# Patient Record
Sex: Female | Born: 1940 | Race: White | Hispanic: No | Marital: Married | Smoking: Never smoker
Health system: Southern US, Community
[De-identification: ages and names within clinical notes are randomized; demographics above are authoritative.]

## PROBLEM LIST (undated history)

## (undated) DIAGNOSIS — R350 Frequency of micturition: Secondary | ICD-10-CM

## (undated) DIAGNOSIS — E781 Pure hyperglyceridemia: Secondary | ICD-10-CM

## (undated) DIAGNOSIS — Z9889 Other specified postprocedural states: Secondary | ICD-10-CM

## (undated) DIAGNOSIS — G4700 Insomnia, unspecified: Secondary | ICD-10-CM

## (undated) DIAGNOSIS — E119 Type 2 diabetes mellitus without complications: Secondary | ICD-10-CM

## (undated) DIAGNOSIS — M797 Fibromyalgia: Secondary | ICD-10-CM

## (undated) DIAGNOSIS — G5602 Carpal tunnel syndrome, left upper limb: Secondary | ICD-10-CM

## (undated) DIAGNOSIS — I10 Essential (primary) hypertension: Secondary | ICD-10-CM

## (undated) DIAGNOSIS — R112 Nausea with vomiting, unspecified: Secondary | ICD-10-CM

## (undated) HISTORY — PX: TOTAL ABDOMINAL HYSTERECTOMY W/ BILATERAL SALPINGOOPHORECTOMY: SHX83

## (undated) HISTORY — DX: Type 2 diabetes mellitus without complications: E11.9

## (undated) HISTORY — DX: Pure hyperglyceridemia: E78.1

## (undated) HISTORY — PX: TONSILLECTOMY: SUR1361

---

## 1999-05-06 ENCOUNTER — Other Ambulatory Visit: Admission: RE | Admit: 1999-05-06 | Discharge: 1999-05-06 | Payer: Self-pay | Admitting: Obstetrics & Gynecology

## 2004-04-30 ENCOUNTER — Encounter: Admission: RE | Admit: 2004-04-30 | Discharge: 2004-04-30 | Payer: Self-pay

## 2007-10-18 ENCOUNTER — Encounter: Admission: RE | Admit: 2007-10-18 | Discharge: 2007-10-18 | Payer: Self-pay | Admitting: Endocrinology

## 2008-03-19 ENCOUNTER — Encounter: Admission: RE | Admit: 2008-03-19 | Discharge: 2008-03-19 | Payer: Self-pay | Admitting: Endocrinology

## 2008-10-16 ENCOUNTER — Ambulatory Visit (HOSPITAL_COMMUNITY): Admission: RE | Admit: 2008-10-16 | Discharge: 2008-10-16 | Payer: Self-pay | Admitting: *Deleted

## 2008-10-16 ENCOUNTER — Encounter (INDEPENDENT_AMBULATORY_CARE_PROVIDER_SITE_OTHER): Payer: Self-pay | Admitting: *Deleted

## 2010-09-21 ENCOUNTER — Encounter: Payer: Self-pay | Admitting: Endocrinology

## 2011-01-12 NOTE — Op Note (Signed)
NAMEAVERIL, DIGMAN                 ACCOUNT NO.:  [id number]   MEDICAL RECORD NO.:  [id number]          PATIENT TYPE:  AMB   LOCATION:  ENDO                         FACILITY:  [facility]   PHYSICIAN:  Georgiana Spinner, M.D.    DATE OF BIRTH:  25-Feb-1941   DATE OF PROCEDURE:  DATE OF DISCHARGE:                               OPERATIVE REPORT   PROCEDURE:  Colonoscopy.   INDICATIONS:  Colon cancer screening and abdominal pain.   ANESTHESIA:  Fentanyl 50 mcg, Versed 2 mg.   DESCRIPTION OF PROCEDURE:  With the patient mildly sedated in the left  lateral decubitus position, the Pentax videoscopic colonoscope was  inserted in the rectum and passed through a diverticular filled sigmoid  colon to reach the cecum, identified by ileocecal valve and adjacent  cecum, both of which were photographed.  From this point, the  colonoscope was slowly withdrawn taking circumferential views of colonic  mucosa, stopping only in the rectum which appeared normal on direct and  showed hemorrhoids on retroflexed view.  The endoscope was straightened  and withdrawn.  The patient's vital signs and pulse oximeter remained  stable.  The patient tolerated procedure well without apparent  complications.   FINDINGS:  Diverticulosis of the sigmoid colon.  Internal hemorrhoids,  otherwise unremarkable exam.   PLAN:  Consider repeat examination in 5-10 years.           ______________________________  Georgiana Spinner, M.D.     GMO/MEDQ  D:  10/16/2008  T:  10/16/2008  Job:  [id number]

## 2011-01-12 NOTE — Op Note (Signed)
NAMEPORSCHE, NOGUCHI                 ACCOUNT NO.:  [id number]   MEDICAL RECORD NO.:  [id number]          PATIENT TYPE:  AMB   LOCATION:  ENDO                         FACILITY:  [facility]   PHYSICIAN:  Georgiana Spinner, M.D.    DATE OF BIRTH:  11/03/1940   DATE OF PROCEDURE:  10/16/2008  DATE OF DISCHARGE:                               OPERATIVE REPORT   PROCEDURE:  Upper endoscopy.   INDICATIONS:  Abdominal pain.   ANESTHESIA:  Fentanyl 50 mcg, Versed 7 mg.   PROCEDURE:  With the patient mildly sedated in the left lateral  decubitus position the Pentax videoscopic endoscope was inserted into  the mouth, passed under direct vision through the esophagus, which  appeared normal, into the stomach.  Fundus, body, antrum was visualized  and blood flecks were seen throughout the stomach.  Photographs were  taken.  The antrum appeared mildly erythematous and therefore it was  photographed and biopsied.  Duodenal bulb and second portion of duodenum  were visualized and appeared normal.  From this point the endoscope was  slowly withdrawn, taking circumferential views of duodenal mucosa until  the endoscope had been pulled back into the stomach, placed in  retroflexion to view the stomach from below.  The endoscope was  straightened and withdrawn, taking circumferential views of remaining  gastric and esophageal mucosa.  The patient's vital signs, pulse  oximeter remained stable.  The patient tolerated the procedure well,  without apparent complications.   FINDINGS:  Erythematous changes of antrum with blood flecks seen in the  stomach.  Await biopsy report.  The patient will call me for results and  follow up with me as an outpatient.  We will start the patient on PPI  therapy.           ______________________________  Georgiana Spinner, M.D.     GMO/MEDQ  D:  10/16/2008  T:  10/16/2008  Job:  [id number]

## 2011-08-18 ENCOUNTER — Other Ambulatory Visit: Payer: Self-pay | Admitting: Orthopedic Surgery

## 2011-08-21 ENCOUNTER — Other Ambulatory Visit: Payer: Self-pay | Admitting: Orthopedic Surgery

## 2011-09-13 ENCOUNTER — Encounter (HOSPITAL_BASED_OUTPATIENT_CLINIC_OR_DEPARTMENT_OTHER): Payer: Self-pay | Admitting: *Deleted

## 2011-09-13 NOTE — Progress Notes (Signed)
NPO AFTER MN. ARRIVES AT 1030. NEEDS ISTAT AND EKG. WILL TAKE METOPROLOL AND TYLENOL AM OF SURG. W/ SIP OF WATER.

## 2011-09-14 ENCOUNTER — Encounter (HOSPITAL_BASED_OUTPATIENT_CLINIC_OR_DEPARTMENT_OTHER): Payer: Self-pay | Admitting: Anesthesiology

## 2011-09-14 ENCOUNTER — Ambulatory Visit (HOSPITAL_BASED_OUTPATIENT_CLINIC_OR_DEPARTMENT_OTHER)
Admission: RE | Admit: 2011-09-14 | Discharge: 2011-09-14 | Disposition: A | Discharge status: Home / Self Care | Payer: Medicare Other | Source: Ambulatory Visit | Attending: Orthopedic Surgery | Admitting: Orthopedic Surgery

## 2011-09-14 ENCOUNTER — Encounter (HOSPITAL_BASED_OUTPATIENT_CLINIC_OR_DEPARTMENT_OTHER)
Admission: RE | Disposition: A | Discharge status: Home / Self Care | Payer: Self-pay | Source: Ambulatory Visit | Attending: Orthopedic Surgery

## 2011-09-14 ENCOUNTER — Other Ambulatory Visit: Payer: Self-pay

## 2011-09-14 ENCOUNTER — Ambulatory Visit (HOSPITAL_BASED_OUTPATIENT_CLINIC_OR_DEPARTMENT_OTHER): Payer: Medicare Other | Admitting: Anesthesiology

## 2011-09-14 ENCOUNTER — Encounter (HOSPITAL_BASED_OUTPATIENT_CLINIC_OR_DEPARTMENT_OTHER): Payer: Self-pay | Admitting: *Deleted

## 2011-09-14 DIAGNOSIS — G5600 Carpal tunnel syndrome, unspecified upper limb: Secondary | ICD-10-CM | POA: Diagnosis not present

## 2011-09-14 DIAGNOSIS — E119 Type 2 diabetes mellitus without complications: Secondary | ICD-10-CM | POA: Diagnosis not present

## 2011-09-14 DIAGNOSIS — R209 Unspecified disturbances of skin sensation: Secondary | ICD-10-CM | POA: Insufficient documentation

## 2011-09-14 DIAGNOSIS — M720 Palmar fascial fibromatosis [Dupuytren]: Secondary | ICD-10-CM | POA: Diagnosis not present

## 2011-09-14 DIAGNOSIS — Z79899 Other long term (current) drug therapy: Secondary | ICD-10-CM | POA: Diagnosis not present

## 2011-09-14 DIAGNOSIS — IMO0001 Reserved for inherently not codable concepts without codable children: Secondary | ICD-10-CM | POA: Insufficient documentation

## 2011-09-14 DIAGNOSIS — I10 Essential (primary) hypertension: Secondary | ICD-10-CM | POA: Insufficient documentation

## 2011-09-14 DIAGNOSIS — D492 Neoplasm of unspecified behavior of bone, soft tissue, and skin: Secondary | ICD-10-CM | POA: Insufficient documentation

## 2011-09-14 DIAGNOSIS — M79609 Pain in unspecified limb: Secondary | ICD-10-CM | POA: Insufficient documentation

## 2011-09-14 HISTORY — DX: Other specified postprocedural states: R11.2

## 2011-09-14 HISTORY — DX: Essential (primary) hypertension: I10

## 2011-09-14 HISTORY — PX: DUPUYTREN CONTRACTURE RELEASE: SHX1478

## 2011-09-14 HISTORY — DX: Fibromyalgia: M79.7

## 2011-09-14 HISTORY — DX: Frequency of micturition: R35.0

## 2011-09-14 HISTORY — DX: Other specified postprocedural states: Z98.890

## 2011-09-14 HISTORY — PX: CARPAL TUNNEL RELEASE: SHX101

## 2011-09-14 HISTORY — DX: Carpal tunnel syndrome, left upper limb: G56.02

## 2011-09-14 HISTORY — DX: Insomnia, unspecified: G47.00

## 2011-09-14 LAB — GLUCOSE, CAPILLARY: Glucose-Capillary: 172 mg/dL — ABNORMAL HIGH (ref 70–99)

## 2011-09-14 SURGERY — CARPAL TUNNEL RELEASE
Anesthesia: General | Site: Hand | Laterality: Left | Wound class: Clean

## 2011-09-14 MED ORDER — OXYCODONE-ACETAMINOPHEN 5-325 MG PO TABS: 1.0000 | ORAL_TABLET | ORAL | Status: DC | PRN

## 2011-09-14 MED ORDER — PROMETHAZINE HCL 25 MG/ML IJ SOLN
6.2500 mg | INTRAMUSCULAR | Status: DC | PRN
  Administered 2011-09-14: 6.25 mg via INTRAVENOUS

## 2011-09-14 MED ORDER — ONDANSETRON HCL 4 MG/2ML IJ SOLN
INTRAMUSCULAR | Status: DC | PRN
  Administered 2011-09-14: 4 mg via INTRAVENOUS

## 2011-09-14 MED ORDER — DROPERIDOL 2.5 MG/ML IJ SOLN
INTRAMUSCULAR | Status: DC | PRN
  Administered 2011-09-14: 0.625 mg via INTRAVENOUS

## 2011-09-14 MED ORDER — LIDOCAINE HCL (CARDIAC) 20 MG/ML IV SOLN
INTRAVENOUS | Status: DC | PRN
  Administered 2011-09-14: 60 mg via INTRAVENOUS

## 2011-09-14 MED ORDER — DEXTROSE-NACL 5-0.45 % IV SOLN: INTRAVENOUS | Status: DC

## 2011-09-14 MED ORDER — PROPOFOL 10 MG/ML IV EMUL
INTRAVENOUS | Status: DC | PRN
  Administered 2011-09-14: 50 mg via INTRAVENOUS
  Administered 2011-09-14: 40 mg via INTRAVENOUS

## 2011-09-14 MED ORDER — METHOCARBAMOL 500 MG PO TABS: 500.0000 mg | ORAL_TABLET | Freq: Three times a day (TID) | ORAL | Status: DC

## 2011-09-14 MED ORDER — LACTATED RINGERS IV SOLN
INTRAVENOUS | Status: DC
  Administered 2011-09-14: 100 mL/h via INTRAVENOUS
  Administered 2011-09-14: 12:00:00 via INTRAVENOUS

## 2011-09-14 MED ORDER — FENTANYL CITRATE 0.05 MG/ML IJ SOLN
25.0000 ug | INTRAMUSCULAR | Status: DC | PRN
  Administered 2011-09-14 (×2): 25 ug via INTRAVENOUS

## 2011-09-14 MED ORDER — FENTANYL CITRATE 0.05 MG/ML IJ SOLN
INTRAMUSCULAR | Status: DC | PRN
  Administered 2011-09-14 (×3): 25 ug via INTRAVENOUS

## 2011-09-14 MED ORDER — OXYCODONE-ACETAMINOPHEN 5-325 MG PO TABS: 1.0000 | ORAL_TABLET | ORAL | Status: AC | PRN

## 2011-09-14 MED ORDER — POVIDONE-IODINE 7.5 % EX SOLN: Freq: Once | CUTANEOUS | Status: DC

## 2011-09-14 SURGICAL SUPPLY — 48 items
BANDAGE CONFORM 3  STR LF (GAUZE/BANDAGES/DRESSINGS) ×2 IMPLANT
BANDAGE ELASTIC 3 VELCRO ST LF (GAUZE/BANDAGES/DRESSINGS) ×1 IMPLANT
BANDAGE ELASTIC 4 VELCRO ST LF (GAUZE/BANDAGES/DRESSINGS) IMPLANT
BLADE SURG 15 STRL LF DISP TIS (BLADE) ×1 IMPLANT
BLADE SURG 15 STRL SS (BLADE) ×2
BNDG CMPR 9X4 STRL LF SNTH (GAUZE/BANDAGES/DRESSINGS) ×1
BNDG ESMARK 4X9 LF (GAUZE/BANDAGES/DRESSINGS) ×2 IMPLANT
CLOTH BEACON ORANGE TIMEOUT ST (SAFETY) ×2 IMPLANT
CORDS BIPOLAR (ELECTRODE) ×2 IMPLANT
COVER TABLE BACK 60X90 (DRAPES) ×2 IMPLANT
DRAPE EXTREMITY T 121X128X90 (DRAPE) ×2 IMPLANT
DRAPE LG THREE QUARTER DISP (DRAPES) ×4 IMPLANT
DRAPE U-SHAPE 47X51 STRL (DRAPES) ×2 IMPLANT
DRSG EMULSION OIL 3X3 NADH (GAUZE/BANDAGES/DRESSINGS) ×2 IMPLANT
DURAPREP 26ML APPLICATOR (WOUND CARE) ×2 IMPLANT
ELECT REM PT RETURN 9FT ADLT (ELECTROSURGICAL)
ELECTRODE REM PT RTRN 9FT ADLT (ELECTROSURGICAL) IMPLANT
GLOVE BIOGEL PI IND STRL 6.5 (GLOVE) IMPLANT
GLOVE BIOGEL PI INDICATOR 6.5 (GLOVE) ×1
GLOVE ECLIPSE 6.0 STRL STRAW (GLOVE) ×1 IMPLANT
GLOVE ECLIPSE 8.0 STRL XLNG CF (GLOVE) ×3 IMPLANT
GLOVE INDICATOR 8.0 STRL GRN (GLOVE) ×2 IMPLANT
GOWN W/COTTON TOWEL STD LRG (GOWNS) ×2 IMPLANT
GOWN XL W/COTTON TOWEL STD (GOWNS) ×2 IMPLANT
MARKER SKIN DUAL TIP RULER LAB (MISCELLANEOUS) ×2 IMPLANT
NEEDLE 27GAX1X1/2 (NEEDLE) IMPLANT
NEEDLE HYPO 22GX1.5 SAFETY (NEEDLE) IMPLANT
NS IRRIG 500ML POUR BTL (IV SOLUTION) ×2 IMPLANT
PACK BASIN DAY SURGERY FS (CUSTOM PROCEDURE TRAY) ×2 IMPLANT
PAD CAST 3X4 CTTN HI CHSV (CAST SUPPLIES) IMPLANT
PAD CAST 4YDX4 CTTN HI CHSV (CAST SUPPLIES) IMPLANT
PADDING CAST ABS 4INX4YD NS (CAST SUPPLIES) ×1
PADDING CAST ABS COTTON 4X4 ST (CAST SUPPLIES) ×1 IMPLANT
PADDING CAST COTTON 3X4 STRL (CAST SUPPLIES)
PADDING CAST COTTON 4X4 STRL (CAST SUPPLIES)
PADDING WEBRIL 4 STERILE (GAUZE/BANDAGES/DRESSINGS) ×1 IMPLANT
PENCIL BUTTON HOLSTER BLD 10FT (ELECTRODE) IMPLANT
SPLINT PLASTER 3X15 (CAST SUPPLIES) ×1 IMPLANT
SPLINT PLASTER CAST XFAST 3X15 (CAST SUPPLIES) IMPLANT
SPLINT PLASTER XTRA FASTSET 3X (CAST SUPPLIES)
SPONGE GAUZE 4X4 12PLY (GAUZE/BANDAGES/DRESSINGS) ×2 IMPLANT
STOCKINETTE 4X48 STRL (DRAPES) ×2 IMPLANT
SUT ETHILON 4 0 PS 2 18 (SUTURE) ×4 IMPLANT
SYR BULB 3OZ (MISCELLANEOUS) ×2 IMPLANT
SYR CONTROL 10ML LL (SYRINGE) IMPLANT
TOWEL OR 17X24 6PK STRL BLUE (TOWEL DISPOSABLE) ×4 IMPLANT
UNDERPAD 30X30 INCONTINENT (UNDERPADS AND DIAPERS) ×2 IMPLANT
WATER STERILE IRR 500ML POUR (IV SOLUTION) ×2 IMPLANT

## 2011-09-14 NOTE — Anesthesia Preprocedure Evaluation (Signed)
Anesthesia Evaluation  Patient identified by MRN, date of birth, ID band Patient awake    Reviewed: Allergy & Precautions, H&P , NPO status , Patient's Chart, lab work & pertinent test results  History of Anesthesia Complications (+) PONV  Airway Mallampati: II TM Distance: >3 FB Neck ROM: Full    Dental No notable dental hx.    Pulmonary neg pulmonary ROS,  clear to auscultation  Pulmonary exam normal       Cardiovascular hypertension, neg cardio ROS Regular Normal    Neuro/Psych Negative Neurological ROS  Negative Psych ROS   GI/Hepatic negative GI ROS, Neg liver ROS,   Endo/Other  Negative Endocrine ROSDiabetes mellitus-  Renal/GU negative Renal ROS  Genitourinary negative   Musculoskeletal negative musculoskeletal ROS (+) Fibromyalgia -  Abdominal   Peds negative pediatric ROS (+)  Hematology negative hematology ROS (+)   Anesthesia Other Findings   Reproductive/Obstetrics negative OB ROS                           Anesthesia Physical Anesthesia Plan  ASA: II  Anesthesia Plan: General   Post-op Pain Management:    Induction: Intravenous  Airway Management Planned: LMA  Additional Equipment:   Intra-op Plan:   Post-operative Plan:   Informed Consent: I have reviewed the patients History and Physical, chart, labs and discussed the procedure including the risks, benefits and alternatives for the proposed anesthesia with the patient or authorized representative who has indicated his/her understanding and acceptance.   Dental advisory given  Plan Discussed with: CRNA  Anesthesia Plan Comments:         Anesthesia Quick Evaluation

## 2011-09-14 NOTE — Transfer of Care (Signed)
Immediate Anesthesia Transfer of Care Note  Patient: Lori Michael  Procedure(s) Performed:  CARPAL TUNNEL RELEASE; DUPUYTREN CONTRACTURE RELEASE  Patient Location: PACU  Anesthesia Type: General  Level of Consciousness: sedated and responds to stimulation  Airway & Oxygen Therapy: Patient Spontanous Breathing  Post-op Assessment: Report given to PACU RN  Post vital signs: Reviewed and stable Filed Vitals:   09/14/11 1411  BP: 198/85  Pulse:   Temp: 35.9 C  Resp: 15    Complications: No apparent anesthesia complications

## 2011-09-14 NOTE — Anesthesia Procedure Notes (Signed)
Procedure Name: LMA Insertion Date/Time: 09/14/2011 1:08 PM Performed by: Maris Berger Pre-anesthesia Checklist: Patient identified, Emergency Drugs available, Suction available and Patient being monitored Patient Re-evaluated:Patient Re-evaluated prior to inductionOxygen Delivery Method: Circle System Utilized Preoxygenation: Pre-oxygenation with 100% oxygen Intubation Type: IV induction Ventilation: Mask ventilation without difficulty LMA: LMA inserted and LMA with gastric port inserted LMA Size: 4.0 Number of attempts: 1 Placement Confirmation: positive ETCO2 Tube secured with: Tape Dental Injury: Teeth and Oropharynx as per pre-operative assessment

## 2011-09-14 NOTE — Brief Op Note (Signed)
09/14/2011  2:10 PM  PATIENT:  Lori Michael  71 y.o. female  PRE-OPERATIVE DIAGNOSIS:  left carpal tunel syndrome, Dupuytrens nodule left hand  POST-OPERATIVE DIAGNOSIS:  left carpal tunel syndrome, Dupuytrens left hand  PROCEDURE:  Procedure(s): CARPAL TUNNEL RELEASE Excision Dupuytren's nodule left hand  SURGEON:  Surgeon(s): Fayrene Fearing P Trenae Brunke  PHYSICIAN ASSISTANT:   ASSISTANTS: Mr Idolina Primer [facility]  ANESTHESIA:   general  EBL:  Total I/O In: 150 [I.V.:150] Out: -   BLOOD ADMINISTERED:none  DRAINS: none   LOCAL MEDICATIONS USED:  NONE  SPECIMEN:  No Specimen  DISPOSITION OF SPECIMEN:  N/A  COUNTS:  YES  TOURNIQUET:   Total Tourniquet Time Documented: Upper Arm (Left) - 45 minutes  DICTATION: .Other Dictation: Dictation Number [id number]  PLAN OF CARE: Discharge to home after PACU  PATIENT DISPOSITION:  PACU - hemodynamically stable.

## 2011-09-14 NOTE — Progress Notes (Signed)
Report received from [facility] for pt relief

## 2011-09-14 NOTE — H&P (Signed)
Lori Michael is an 71 y.o. female.   Chief Complaint: pain and numbness,nodule lt hand HPI: NCV'  Demonstrate median neuropathy  Past Medical History  Diagnosis Date  . Hypertension   . Diabetes mellitus ORAL MEDS  . Frequency of urination   . Fibromyalgia   . Insomnia   . Carpal tunnel syndrome on left   . PONV (postoperative nausea and vomiting)     Past Surgical History  Procedure Date  . Total abdominal hysterectomy w/ bilateral salpingoophorectomy AGE 44  . Tonsillectomy AGE 71    History reviewed. No pertinent family history. Social History:  reports that she has never smoked. She has never used smokeless tobacco. She reports that she does not drink alcohol or use illicit drugs.  Allergies: No Known Allergies  Medications Prior to Admission  Medication Dose Route Frequency Provider Last Rate Last Dose  . dextrose 5 %-0.45 % sodium chloride infusion   Intravenous Continuous James P Aplington      . lactated ringers infusion   Intravenous Continuous Einar Pheasant, MD      . povidone-iodine (BETADINE) 7.5 % scrub   Topical Once James P Aplington       Medications Prior to Admission  Medication Sig Dispense Refill  . acetaminophen (TYLENOL) 500 MG tablet Take 500 mg by mouth every 6 (six) hours as needed.      . Amlodipine Besylate-Valsartan (EXFORGE PO) Take 1 tablet by mouth every morning.      . Estradiol (ESTRACE VA) Place 1 application vaginally once a week.      Marland Kitchen GLIMEPIRIDE PO Take 1 tablet by mouth 2 (two) times daily.      . metoprolol succinate (TOPROL-XL) 50 MG 24 hr tablet Take 50 mg by mouth every morning. Take with or immediately following a meal.        Results for orders placed during the hospital encounter of 09/14/11 (from the past 48 hour(s))  POCT I-STAT 4, (NA,K, GLUC, HGB,HCT)     Status: Abnormal   Collection Time   09/14/11 11:52 AM      Component Value Range Comment   Sodium 144  135 - 145 (mEq/L)    Potassium 3.5  3.5 - 5.1 (mEq/L)    Glucose, Bld 181 (*) 70 - 99 (mg/dL)    HCT 56.2  13.0 - 86.5 (%)    Hemoglobin 15.3 (*) 12.0 - 15.0 (g/dL)    No results found.  ROS  Blood pressure 187/86, pulse 66, temperature 97.4 F (36.3 C), temperature source Oral, resp. rate 20, height 5\' 3"  (1.6 m), weight 65.772 kg (145 lb), SpO2 96.00%. Physical Exam  Vitals reviewed. Constitutional: She is oriented to person, place, and time. She appears well-developed and well-nourished.  HENT:  Head: Normocephalic and atraumatic.  Right Ear: External ear normal.  Left Ear: External ear normal.  Eyes: Conjunctivae and EOM are normal. Pupils are equal, round, and reactive to light.  Neck: Normal range of motion. Neck supple.  Cardiovascular: Normal rate and regular rhythm.   Respiratory: Effort normal and breath sounds normal.  GI: Soft. Bowel sounds are normal.  Musculoskeletal: Normal range of motion.  Neurological: She is alert and oriented to person, place, and time. She has normal reflexes.  Skin: Skin is warm and dry.  Psychiatric: She has a normal mood and affect. Her behavior is normal. Judgment and thought content normal.     Assessment/Plan Carpal tunnel syndrome and Dupuytrens nodule lt hand Carpal tunnel release and  excision nodule  APLINGTON,JAMES P 09/14/2011, 12:57 PM

## 2011-09-15 ENCOUNTER — Encounter (HOSPITAL_BASED_OUTPATIENT_CLINIC_OR_DEPARTMENT_OTHER): Payer: Self-pay | Admitting: Orthopedic Surgery

## 2011-09-15 NOTE — Anesthesia Postprocedure Evaluation (Signed)
  Anesthesia Post-op Note  Patient: Lori Michael  Procedure(s) Performed:  CARPAL TUNNEL RELEASE; DUPUYTREN CONTRACTURE RELEASE  Patient Location: PACU  Anesthesia Type: General  Level of Consciousness: awake and alert   Airway and Oxygen Therapy: Patient Spontanous Breathing  Post-op Pain: mild  Post-op Assessment: Post-op Vital signs reviewed, Patient's Cardiovascular Status Stable, Respiratory Function Stable, Patent Airway and No signs of Nausea or vomiting  Post-op Vital Signs: stable  Complications: No apparent anesthesia complications

## 2011-09-15 NOTE — Op Note (Signed)
NAMEBOBBIJO, HOLST                 ACCOUNT NO.:  [id number]  MEDICAL RECORD NO.:  [id number]  LOCATION:                               FACILITY:  [facility]  PHYSICIAN:  Marlowe Kays, M.D.  DATE OF BIRTH:  [birth year]  DATE OF PROCEDURE:  09/14/2011 DATE OF DISCHARGE:                              OPERATIVE REPORT   PREOPERATIVE DIAGNOSES: 1. Carpal tunnel syndrome. 2. Painful Dupuytren nodule mid palm, left hand.  POSTOPERATIVE DIAGNOSES: 1. Carpal tunnel syndrome. 2. Painful Dupuytren nodule mid palm, left hand.  OPERATION: 1. Decompression of median nerve, left wrist and hand. 2. Excision of Dupuytren nodule mid palm, left.  SURGEON:  Marlowe Kays, MD  ASSISTANT:  Druscilla Brownie. Idolina Primer, PA-C  ANESTHESIA:  General.  PATHOLOGY AND JUSTIFICATION FOR PROCEDURE:  Because of pain and numbness in her left hand, nerve conduction studies were taken and demonstrated a median neuropathy.  The nodule was very characteristic of Dupuytren nodule, was very tender.  PROCEDURE IN DETAIL:  Satisfied general anesthesia, pneumatic tourniquet, left upper extremity was Esmarched out nonsterilely and tourniquet inflated and the arm prepped from midforearm to fingertips with DuraPrep and draped as sterile field.  I marked out my usual curved incision along the base of thenar eminence crossing obliquely over the flexor crease in the distal forearm.  The incision was altered slightly to the mid palm area, so that I would have access to the nodule as well. After time-out performed, made incision over the skin line and at the wrist located at the palmaris longus tendon which I retracted to the radial side and the median nerve was identified and protected as I began releasing skin, subcutaneous tissue, and fascia into the distal palm. At the level of the Dupuytren nodule, with sharp dissection, I undermined the skin and was able to remove the nodule completely.  There was no perforation of the skin.   Wound was irrigated with sterile saline and the skin and subcutaneous tissue closed with interrupted 4-0 nylon mattress sutures. Betadine, Adaptic, dry sterile dressing, volar plaster splint were applied.  Tourniquet was released.  She tolerated the procedure well, was taken to recovery room in satisfied condition with no known complications.          ______________________________ Marlowe Kays, M.D.     JA/MEDQ  D:  09/14/2011  T:  09/14/2011  Job:  [id number]

## 2011-11-01 DIAGNOSIS — G5600 Carpal tunnel syndrome, unspecified upper limb: Secondary | ICD-10-CM | POA: Diagnosis not present

## 2011-11-12 DIAGNOSIS — G5600 Carpal tunnel syndrome, unspecified upper limb: Secondary | ICD-10-CM | POA: Diagnosis not present

## 2011-11-19 DIAGNOSIS — G5600 Carpal tunnel syndrome, unspecified upper limb: Secondary | ICD-10-CM | POA: Diagnosis not present

## 2011-11-24 DIAGNOSIS — G5600 Carpal tunnel syndrome, unspecified upper limb: Secondary | ICD-10-CM | POA: Diagnosis not present

## 2011-12-03 DIAGNOSIS — G5600 Carpal tunnel syndrome, unspecified upper limb: Secondary | ICD-10-CM | POA: Diagnosis not present

## 2011-12-10 DIAGNOSIS — E119 Type 2 diabetes mellitus without complications: Secondary | ICD-10-CM | POA: Diagnosis not present

## 2011-12-10 DIAGNOSIS — I10 Essential (primary) hypertension: Secondary | ICD-10-CM | POA: Diagnosis not present

## 2011-12-10 DIAGNOSIS — E789 Disorder of lipoprotein metabolism, unspecified: Secondary | ICD-10-CM | POA: Diagnosis not present

## 2011-12-10 DIAGNOSIS — G5600 Carpal tunnel syndrome, unspecified upper limb: Secondary | ICD-10-CM | POA: Diagnosis not present

## 2011-12-14 DIAGNOSIS — G5600 Carpal tunnel syndrome, unspecified upper limb: Secondary | ICD-10-CM | POA: Diagnosis not present

## 2011-12-17 DIAGNOSIS — E789 Disorder of lipoprotein metabolism, unspecified: Secondary | ICD-10-CM | POA: Diagnosis not present

## 2011-12-17 DIAGNOSIS — L909 Atrophic disorder of skin, unspecified: Secondary | ICD-10-CM | POA: Diagnosis not present

## 2011-12-17 DIAGNOSIS — L919 Hypertrophic disorder of the skin, unspecified: Secondary | ICD-10-CM | POA: Diagnosis not present

## 2011-12-17 DIAGNOSIS — E119 Type 2 diabetes mellitus without complications: Secondary | ICD-10-CM | POA: Diagnosis not present

## 2011-12-17 DIAGNOSIS — I10 Essential (primary) hypertension: Secondary | ICD-10-CM | POA: Diagnosis not present

## 2011-12-17 DIAGNOSIS — E2839 Other primary ovarian failure: Secondary | ICD-10-CM | POA: Diagnosis not present

## 2011-12-20 DIAGNOSIS — G5600 Carpal tunnel syndrome, unspecified upper limb: Secondary | ICD-10-CM | POA: Diagnosis not present

## 2011-12-27 DIAGNOSIS — G5600 Carpal tunnel syndrome, unspecified upper limb: Secondary | ICD-10-CM | POA: Diagnosis not present

## 2012-01-04 DIAGNOSIS — G5600 Carpal tunnel syndrome, unspecified upper limb: Secondary | ICD-10-CM | POA: Diagnosis not present

## 2012-01-11 DIAGNOSIS — G5600 Carpal tunnel syndrome, unspecified upper limb: Secondary | ICD-10-CM | POA: Diagnosis not present

## 2012-01-17 DIAGNOSIS — G5600 Carpal tunnel syndrome, unspecified upper limb: Secondary | ICD-10-CM | POA: Diagnosis not present

## 2012-01-18 DIAGNOSIS — E119 Type 2 diabetes mellitus without complications: Secondary | ICD-10-CM | POA: Diagnosis not present

## 2012-01-18 DIAGNOSIS — I10 Essential (primary) hypertension: Secondary | ICD-10-CM | POA: Diagnosis not present

## 2012-01-18 DIAGNOSIS — E789 Disorder of lipoprotein metabolism, unspecified: Secondary | ICD-10-CM | POA: Diagnosis not present

## 2012-04-10 DIAGNOSIS — G5600 Carpal tunnel syndrome, unspecified upper limb: Secondary | ICD-10-CM | POA: Diagnosis not present

## 2012-04-10 DIAGNOSIS — E789 Disorder of lipoprotein metabolism, unspecified: Secondary | ICD-10-CM | POA: Diagnosis not present

## 2012-04-10 DIAGNOSIS — M720 Palmar fascial fibromatosis [Dupuytren]: Secondary | ICD-10-CM | POA: Diagnosis not present

## 2012-04-10 DIAGNOSIS — E119 Type 2 diabetes mellitus without complications: Secondary | ICD-10-CM | POA: Diagnosis not present

## 2012-04-18 DIAGNOSIS — M79609 Pain in unspecified limb: Secondary | ICD-10-CM | POA: Diagnosis not present

## 2012-04-18 DIAGNOSIS — I10 Essential (primary) hypertension: Secondary | ICD-10-CM | POA: Diagnosis not present

## 2012-04-18 DIAGNOSIS — E119 Type 2 diabetes mellitus without complications: Secondary | ICD-10-CM | POA: Diagnosis not present

## 2012-04-18 DIAGNOSIS — E789 Disorder of lipoprotein metabolism, unspecified: Secondary | ICD-10-CM | POA: Diagnosis not present

## 2012-06-12 DIAGNOSIS — M720 Palmar fascial fibromatosis [Dupuytren]: Secondary | ICD-10-CM | POA: Diagnosis not present

## 2012-06-12 DIAGNOSIS — G5600 Carpal tunnel syndrome, unspecified upper limb: Secondary | ICD-10-CM | POA: Diagnosis not present

## 2012-08-07 DIAGNOSIS — G5600 Carpal tunnel syndrome, unspecified upper limb: Secondary | ICD-10-CM | POA: Diagnosis not present

## 2012-08-07 DIAGNOSIS — R229 Localized swelling, mass and lump, unspecified: Secondary | ICD-10-CM | POA: Diagnosis not present

## 2012-08-08 DIAGNOSIS — E789 Disorder of lipoprotein metabolism, unspecified: Secondary | ICD-10-CM | POA: Diagnosis not present

## 2012-08-08 DIAGNOSIS — E119 Type 2 diabetes mellitus without complications: Secondary | ICD-10-CM | POA: Diagnosis not present

## 2012-08-15 DIAGNOSIS — E119 Type 2 diabetes mellitus without complications: Secondary | ICD-10-CM | POA: Diagnosis not present

## 2012-08-15 DIAGNOSIS — I10 Essential (primary) hypertension: Secondary | ICD-10-CM | POA: Diagnosis not present

## 2012-08-15 DIAGNOSIS — E789 Disorder of lipoprotein metabolism, unspecified: Secondary | ICD-10-CM | POA: Diagnosis not present

## 2012-09-30 DIAGNOSIS — J209 Acute bronchitis, unspecified: Secondary | ICD-10-CM | POA: Diagnosis not present

## 2012-09-30 DIAGNOSIS — J018 Other acute sinusitis: Secondary | ICD-10-CM | POA: Diagnosis not present

## 2012-11-07 DIAGNOSIS — Z1231 Encounter for screening mammogram for malignant neoplasm of breast: Secondary | ICD-10-CM | POA: Diagnosis not present

## 2012-12-11 DIAGNOSIS — Z79899 Other long term (current) drug therapy: Secondary | ICD-10-CM | POA: Diagnosis not present

## 2012-12-11 DIAGNOSIS — E119 Type 2 diabetes mellitus without complications: Secondary | ICD-10-CM | POA: Diagnosis not present

## 2012-12-11 DIAGNOSIS — I10 Essential (primary) hypertension: Secondary | ICD-10-CM | POA: Diagnosis not present

## 2012-12-11 DIAGNOSIS — E789 Disorder of lipoprotein metabolism, unspecified: Secondary | ICD-10-CM | POA: Diagnosis not present

## 2012-12-18 DIAGNOSIS — I10 Essential (primary) hypertension: Secondary | ICD-10-CM | POA: Diagnosis not present

## 2012-12-18 DIAGNOSIS — E789 Disorder of lipoprotein metabolism, unspecified: Secondary | ICD-10-CM | POA: Diagnosis not present

## 2012-12-18 DIAGNOSIS — J9819 Other pulmonary collapse: Secondary | ICD-10-CM | POA: Diagnosis not present

## 2012-12-18 DIAGNOSIS — E119 Type 2 diabetes mellitus without complications: Secondary | ICD-10-CM | POA: Diagnosis not present

## 2013-02-06 DIAGNOSIS — E11329 Type 2 diabetes mellitus with mild nonproliferative diabetic retinopathy without macular edema: Secondary | ICD-10-CM | POA: Diagnosis not present

## 2013-02-06 DIAGNOSIS — E1139 Type 2 diabetes mellitus with other diabetic ophthalmic complication: Secondary | ICD-10-CM | POA: Diagnosis not present

## 2013-02-06 DIAGNOSIS — H2510 Age-related nuclear cataract, unspecified eye: Secondary | ICD-10-CM | POA: Diagnosis not present

## 2013-03-12 DIAGNOSIS — E119 Type 2 diabetes mellitus without complications: Secondary | ICD-10-CM | POA: Diagnosis not present

## 2013-03-12 DIAGNOSIS — E789 Disorder of lipoprotein metabolism, unspecified: Secondary | ICD-10-CM | POA: Diagnosis not present

## 2013-03-19 DIAGNOSIS — E789 Disorder of lipoprotein metabolism, unspecified: Secondary | ICD-10-CM | POA: Diagnosis not present

## 2013-03-19 DIAGNOSIS — I10 Essential (primary) hypertension: Secondary | ICD-10-CM | POA: Diagnosis not present

## 2013-03-19 DIAGNOSIS — E119 Type 2 diabetes mellitus without complications: Secondary | ICD-10-CM | POA: Diagnosis not present

## 2013-07-11 DIAGNOSIS — E119 Type 2 diabetes mellitus without complications: Secondary | ICD-10-CM | POA: Diagnosis not present

## 2013-07-18 DIAGNOSIS — Z23 Encounter for immunization: Secondary | ICD-10-CM | POA: Diagnosis not present

## 2013-07-18 DIAGNOSIS — I10 Essential (primary) hypertension: Secondary | ICD-10-CM | POA: Diagnosis not present

## 2013-07-18 DIAGNOSIS — E119 Type 2 diabetes mellitus without complications: Secondary | ICD-10-CM | POA: Diagnosis not present

## 2013-07-18 DIAGNOSIS — E789 Disorder of lipoprotein metabolism, unspecified: Secondary | ICD-10-CM | POA: Diagnosis not present

## 2013-08-07 DIAGNOSIS — H2510 Age-related nuclear cataract, unspecified eye: Secondary | ICD-10-CM | POA: Diagnosis not present

## 2013-08-07 DIAGNOSIS — E11329 Type 2 diabetes mellitus with mild nonproliferative diabetic retinopathy without macular edema: Secondary | ICD-10-CM | POA: Diagnosis not present

## 2013-08-15 DIAGNOSIS — E119 Type 2 diabetes mellitus without complications: Secondary | ICD-10-CM | POA: Diagnosis not present

## 2013-08-21 DIAGNOSIS — E119 Type 2 diabetes mellitus without complications: Secondary | ICD-10-CM | POA: Diagnosis not present

## 2013-08-21 DIAGNOSIS — E789 Disorder of lipoprotein metabolism, unspecified: Secondary | ICD-10-CM | POA: Diagnosis not present

## 2013-08-21 DIAGNOSIS — I10 Essential (primary) hypertension: Secondary | ICD-10-CM | POA: Diagnosis not present

## 2013-10-30 DIAGNOSIS — I10 Essential (primary) hypertension: Secondary | ICD-10-CM | POA: Diagnosis not present

## 2013-10-30 DIAGNOSIS — E119 Type 2 diabetes mellitus without complications: Secondary | ICD-10-CM | POA: Diagnosis not present

## 2013-10-30 DIAGNOSIS — E789 Disorder of lipoprotein metabolism, unspecified: Secondary | ICD-10-CM | POA: Diagnosis not present

## 2013-11-16 DIAGNOSIS — H25049 Posterior subcapsular polar age-related cataract, unspecified eye: Secondary | ICD-10-CM | POA: Diagnosis not present

## 2013-11-16 DIAGNOSIS — H25019 Cortical age-related cataract, unspecified eye: Secondary | ICD-10-CM | POA: Diagnosis not present

## 2013-11-16 DIAGNOSIS — H18419 Arcus senilis, unspecified eye: Secondary | ICD-10-CM | POA: Diagnosis not present

## 2013-11-16 DIAGNOSIS — E119 Type 2 diabetes mellitus without complications: Secondary | ICD-10-CM | POA: Diagnosis not present

## 2013-11-16 DIAGNOSIS — H02839 Dermatochalasis of unspecified eye, unspecified eyelid: Secondary | ICD-10-CM | POA: Diagnosis not present

## 2013-11-16 DIAGNOSIS — H2510 Age-related nuclear cataract, unspecified eye: Secondary | ICD-10-CM | POA: Diagnosis not present

## 2013-11-28 HISTORY — PX: OTHER SURGICAL HISTORY: SHX169

## 2013-12-18 DIAGNOSIS — E119 Type 2 diabetes mellitus without complications: Secondary | ICD-10-CM | POA: Diagnosis not present

## 2013-12-18 DIAGNOSIS — Z79899 Other long term (current) drug therapy: Secondary | ICD-10-CM | POA: Diagnosis not present

## 2013-12-18 DIAGNOSIS — I10 Essential (primary) hypertension: Secondary | ICD-10-CM | POA: Diagnosis not present

## 2013-12-18 DIAGNOSIS — E789 Disorder of lipoprotein metabolism, unspecified: Secondary | ICD-10-CM | POA: Diagnosis not present

## 2013-12-25 DIAGNOSIS — E789 Disorder of lipoprotein metabolism, unspecified: Secondary | ICD-10-CM | POA: Diagnosis not present

## 2013-12-25 DIAGNOSIS — I10 Essential (primary) hypertension: Secondary | ICD-10-CM | POA: Diagnosis not present

## 2013-12-25 DIAGNOSIS — Z01818 Encounter for other preprocedural examination: Secondary | ICD-10-CM | POA: Diagnosis not present

## 2013-12-25 DIAGNOSIS — E119 Type 2 diabetes mellitus without complications: Secondary | ICD-10-CM | POA: Diagnosis not present

## 2013-12-31 DIAGNOSIS — H2510 Age-related nuclear cataract, unspecified eye: Secondary | ICD-10-CM | POA: Diagnosis not present

## 2013-12-31 DIAGNOSIS — H52209 Unspecified astigmatism, unspecified eye: Secondary | ICD-10-CM | POA: Diagnosis not present

## 2013-12-31 DIAGNOSIS — H269 Unspecified cataract: Secondary | ICD-10-CM | POA: Diagnosis not present

## 2014-01-01 DIAGNOSIS — H2510 Age-related nuclear cataract, unspecified eye: Secondary | ICD-10-CM | POA: Diagnosis not present

## 2014-01-14 DIAGNOSIS — H2510 Age-related nuclear cataract, unspecified eye: Secondary | ICD-10-CM | POA: Diagnosis not present

## 2014-01-14 DIAGNOSIS — H52209 Unspecified astigmatism, unspecified eye: Secondary | ICD-10-CM | POA: Diagnosis not present

## 2014-01-14 DIAGNOSIS — H269 Unspecified cataract: Secondary | ICD-10-CM | POA: Diagnosis not present

## 2014-01-24 DIAGNOSIS — I10 Essential (primary) hypertension: Secondary | ICD-10-CM | POA: Diagnosis not present

## 2014-02-18 DIAGNOSIS — N951 Menopausal and female climacteric states: Secondary | ICD-10-CM | POA: Diagnosis not present

## 2014-02-18 DIAGNOSIS — Z1231 Encounter for screening mammogram for malignant neoplasm of breast: Secondary | ICD-10-CM | POA: Diagnosis not present

## 2014-04-22 DIAGNOSIS — E119 Type 2 diabetes mellitus without complications: Secondary | ICD-10-CM | POA: Diagnosis not present

## 2014-04-29 DIAGNOSIS — E119 Type 2 diabetes mellitus without complications: Secondary | ICD-10-CM | POA: Diagnosis not present

## 2014-04-29 DIAGNOSIS — M899 Disorder of bone, unspecified: Secondary | ICD-10-CM | POA: Diagnosis not present

## 2014-04-29 DIAGNOSIS — R5381 Other malaise: Secondary | ICD-10-CM | POA: Diagnosis not present

## 2014-04-29 DIAGNOSIS — M949 Disorder of cartilage, unspecified: Secondary | ICD-10-CM | POA: Diagnosis not present

## 2014-04-29 DIAGNOSIS — R5383 Other fatigue: Secondary | ICD-10-CM | POA: Diagnosis not present

## 2014-04-29 DIAGNOSIS — I10 Essential (primary) hypertension: Secondary | ICD-10-CM | POA: Diagnosis not present

## 2014-04-29 DIAGNOSIS — Z79899 Other long term (current) drug therapy: Secondary | ICD-10-CM | POA: Diagnosis not present

## 2014-08-12 DIAGNOSIS — J0100 Acute maxillary sinusitis, unspecified: Secondary | ICD-10-CM | POA: Diagnosis not present

## 2014-08-19 DIAGNOSIS — E119 Type 2 diabetes mellitus without complications: Secondary | ICD-10-CM | POA: Diagnosis not present

## 2014-08-19 DIAGNOSIS — H547 Unspecified visual loss: Secondary | ICD-10-CM | POA: Diagnosis not present

## 2014-08-19 DIAGNOSIS — H47213 Primary optic atrophy, bilateral: Secondary | ICD-10-CM | POA: Diagnosis not present

## 2014-08-20 DIAGNOSIS — E118 Type 2 diabetes mellitus with unspecified complications: Secondary | ICD-10-CM | POA: Diagnosis not present

## 2014-08-27 DIAGNOSIS — Z23 Encounter for immunization: Secondary | ICD-10-CM | POA: Diagnosis not present

## 2014-08-27 DIAGNOSIS — F439 Reaction to severe stress, unspecified: Secondary | ICD-10-CM | POA: Diagnosis not present

## 2014-08-27 DIAGNOSIS — E118 Type 2 diabetes mellitus with unspecified complications: Secondary | ICD-10-CM | POA: Diagnosis not present

## 2014-08-27 DIAGNOSIS — I10 Essential (primary) hypertension: Secondary | ICD-10-CM | POA: Diagnosis not present

## 2014-12-23 DIAGNOSIS — E789 Disorder of lipoprotein metabolism, unspecified: Secondary | ICD-10-CM | POA: Diagnosis not present

## 2014-12-23 DIAGNOSIS — E118 Type 2 diabetes mellitus with unspecified complications: Secondary | ICD-10-CM | POA: Diagnosis not present

## 2014-12-23 DIAGNOSIS — I10 Essential (primary) hypertension: Secondary | ICD-10-CM | POA: Diagnosis not present

## 2014-12-30 DIAGNOSIS — M1810 Unilateral primary osteoarthritis of first carpometacarpal joint, unspecified hand: Secondary | ICD-10-CM | POA: Diagnosis not present

## 2014-12-30 DIAGNOSIS — E559 Vitamin D deficiency, unspecified: Secondary | ICD-10-CM | POA: Diagnosis not present

## 2014-12-30 DIAGNOSIS — I10 Essential (primary) hypertension: Secondary | ICD-10-CM | POA: Diagnosis not present

## 2014-12-30 DIAGNOSIS — E118 Type 2 diabetes mellitus with unspecified complications: Secondary | ICD-10-CM | POA: Diagnosis not present

## 2015-05-27 DIAGNOSIS — W57XXXA Bitten or stung by nonvenomous insect and other nonvenomous arthropods, initial encounter: Secondary | ICD-10-CM | POA: Diagnosis not present

## 2015-05-27 DIAGNOSIS — R21 Rash and other nonspecific skin eruption: Secondary | ICD-10-CM | POA: Diagnosis not present

## 2015-06-25 DIAGNOSIS — E789 Disorder of lipoprotein metabolism, unspecified: Secondary | ICD-10-CM | POA: Diagnosis not present

## 2015-06-25 DIAGNOSIS — E118 Type 2 diabetes mellitus with unspecified complications: Secondary | ICD-10-CM | POA: Diagnosis not present

## 2015-06-25 DIAGNOSIS — I10 Essential (primary) hypertension: Secondary | ICD-10-CM | POA: Diagnosis not present

## 2015-06-25 DIAGNOSIS — E559 Vitamin D deficiency, unspecified: Secondary | ICD-10-CM | POA: Diagnosis not present

## 2015-07-02 DIAGNOSIS — E118 Type 2 diabetes mellitus with unspecified complications: Secondary | ICD-10-CM | POA: Diagnosis not present

## 2015-07-02 DIAGNOSIS — I10 Essential (primary) hypertension: Secondary | ICD-10-CM | POA: Diagnosis not present

## 2015-07-02 DIAGNOSIS — L0390 Cellulitis, unspecified: Secondary | ICD-10-CM | POA: Diagnosis not present

## 2015-09-02 DIAGNOSIS — H547 Unspecified visual loss: Secondary | ICD-10-CM | POA: Diagnosis not present

## 2015-09-02 DIAGNOSIS — E113311 Type 2 diabetes mellitus with moderate nonproliferative diabetic retinopathy with macular edema, right eye: Secondary | ICD-10-CM | POA: Diagnosis not present

## 2015-09-02 DIAGNOSIS — H47213 Primary optic atrophy, bilateral: Secondary | ICD-10-CM | POA: Diagnosis not present

## 2015-10-22 DIAGNOSIS — E118 Type 2 diabetes mellitus with unspecified complications: Secondary | ICD-10-CM | POA: Diagnosis not present

## 2015-10-22 DIAGNOSIS — I10 Essential (primary) hypertension: Secondary | ICD-10-CM | POA: Diagnosis not present

## 2015-10-22 DIAGNOSIS — E789 Disorder of lipoprotein metabolism, unspecified: Secondary | ICD-10-CM | POA: Diagnosis not present

## 2015-10-29 DIAGNOSIS — E118 Type 2 diabetes mellitus with unspecified complications: Secondary | ICD-10-CM | POA: Diagnosis not present

## 2015-10-29 DIAGNOSIS — I10 Essential (primary) hypertension: Secondary | ICD-10-CM | POA: Diagnosis not present

## 2015-10-29 DIAGNOSIS — E789 Disorder of lipoprotein metabolism, unspecified: Secondary | ICD-10-CM | POA: Diagnosis not present

## 2015-12-23 DIAGNOSIS — Z1231 Encounter for screening mammogram for malignant neoplasm of breast: Secondary | ICD-10-CM | POA: Diagnosis not present

## 2015-12-23 DIAGNOSIS — B372 Candidiasis of skin and nail: Secondary | ICD-10-CM | POA: Diagnosis not present

## 2015-12-23 DIAGNOSIS — N959 Unspecified menopausal and perimenopausal disorder: Secondary | ICD-10-CM | POA: Diagnosis not present

## 2015-12-23 DIAGNOSIS — Z6826 Body mass index (BMI) 26.0-26.9, adult: Secondary | ICD-10-CM | POA: Diagnosis not present

## 2015-12-24 DIAGNOSIS — E789 Disorder of lipoprotein metabolism, unspecified: Secondary | ICD-10-CM | POA: Diagnosis not present

## 2015-12-24 DIAGNOSIS — E559 Vitamin D deficiency, unspecified: Secondary | ICD-10-CM | POA: Diagnosis not present

## 2015-12-24 DIAGNOSIS — I10 Essential (primary) hypertension: Secondary | ICD-10-CM | POA: Diagnosis not present

## 2015-12-24 DIAGNOSIS — Z0000 Encounter for general adult medical examination without abnormal findings: Secondary | ICD-10-CM | POA: Diagnosis not present

## 2015-12-24 DIAGNOSIS — N390 Urinary tract infection, site not specified: Secondary | ICD-10-CM | POA: Diagnosis not present

## 2015-12-24 DIAGNOSIS — E118 Type 2 diabetes mellitus with unspecified complications: Secondary | ICD-10-CM | POA: Diagnosis not present

## 2016-01-02 DIAGNOSIS — Z23 Encounter for immunization: Secondary | ICD-10-CM | POA: Diagnosis not present

## 2016-01-02 DIAGNOSIS — I10 Essential (primary) hypertension: Secondary | ICD-10-CM | POA: Diagnosis not present

## 2016-01-02 DIAGNOSIS — E559 Vitamin D deficiency, unspecified: Secondary | ICD-10-CM | POA: Diagnosis not present

## 2016-01-02 DIAGNOSIS — E118 Type 2 diabetes mellitus with unspecified complications: Secondary | ICD-10-CM | POA: Diagnosis not present

## 2016-03-29 DIAGNOSIS — E118 Type 2 diabetes mellitus with unspecified complications: Secondary | ICD-10-CM | POA: Diagnosis not present

## 2016-03-29 DIAGNOSIS — I10 Essential (primary) hypertension: Secondary | ICD-10-CM | POA: Diagnosis not present

## 2016-03-29 DIAGNOSIS — E789 Disorder of lipoprotein metabolism, unspecified: Secondary | ICD-10-CM | POA: Diagnosis not present

## 2016-03-29 DIAGNOSIS — M810 Age-related osteoporosis without current pathological fracture: Secondary | ICD-10-CM | POA: Diagnosis not present

## 2016-04-06 DIAGNOSIS — I10 Essential (primary) hypertension: Secondary | ICD-10-CM | POA: Diagnosis not present

## 2016-04-06 DIAGNOSIS — G4700 Insomnia, unspecified: Secondary | ICD-10-CM | POA: Diagnosis not present

## 2016-04-06 DIAGNOSIS — E118 Type 2 diabetes mellitus with unspecified complications: Secondary | ICD-10-CM | POA: Diagnosis not present

## 2016-04-27 ENCOUNTER — Other Ambulatory Visit: Payer: Self-pay

## 2016-04-28 DIAGNOSIS — N3090 Cystitis, unspecified without hematuria: Secondary | ICD-10-CM | POA: Diagnosis not present

## 2016-04-28 DIAGNOSIS — N3001 Acute cystitis with hematuria: Secondary | ICD-10-CM | POA: Diagnosis not present

## 2016-04-28 DIAGNOSIS — J029 Acute pharyngitis, unspecified: Secondary | ICD-10-CM | POA: Diagnosis not present

## 2016-04-28 DIAGNOSIS — J0100 Acute maxillary sinusitis, unspecified: Secondary | ICD-10-CM | POA: Diagnosis not present

## 2016-06-02 DIAGNOSIS — E118 Type 2 diabetes mellitus with unspecified complications: Secondary | ICD-10-CM | POA: Diagnosis not present

## 2016-06-02 DIAGNOSIS — I10 Essential (primary) hypertension: Secondary | ICD-10-CM | POA: Diagnosis not present

## 2016-06-09 DIAGNOSIS — E789 Disorder of lipoprotein metabolism, unspecified: Secondary | ICD-10-CM | POA: Diagnosis not present

## 2016-06-09 DIAGNOSIS — E118 Type 2 diabetes mellitus with unspecified complications: Secondary | ICD-10-CM | POA: Diagnosis not present

## 2016-06-09 DIAGNOSIS — I10 Essential (primary) hypertension: Secondary | ICD-10-CM | POA: Diagnosis not present

## 2016-08-18 DIAGNOSIS — E118 Type 2 diabetes mellitus with unspecified complications: Secondary | ICD-10-CM | POA: Diagnosis not present

## 2016-08-25 DIAGNOSIS — E118 Type 2 diabetes mellitus with unspecified complications: Secondary | ICD-10-CM | POA: Diagnosis not present

## 2016-08-25 DIAGNOSIS — Z23 Encounter for immunization: Secondary | ICD-10-CM | POA: Diagnosis not present

## 2016-08-25 DIAGNOSIS — I10 Essential (primary) hypertension: Secondary | ICD-10-CM | POA: Diagnosis not present

## 2016-10-19 DIAGNOSIS — H52223 Regular astigmatism, bilateral: Secondary | ICD-10-CM | POA: Diagnosis not present

## 2016-10-19 DIAGNOSIS — I10 Essential (primary) hypertension: Secondary | ICD-10-CM | POA: Diagnosis not present

## 2016-10-19 DIAGNOSIS — H47213 Primary optic atrophy, bilateral: Secondary | ICD-10-CM | POA: Diagnosis not present

## 2016-10-19 DIAGNOSIS — E118 Type 2 diabetes mellitus with unspecified complications: Secondary | ICD-10-CM | POA: Diagnosis not present

## 2016-10-19 DIAGNOSIS — E113311 Type 2 diabetes mellitus with moderate nonproliferative diabetic retinopathy with macular edema, right eye: Secondary | ICD-10-CM | POA: Diagnosis not present

## 2016-10-26 DIAGNOSIS — I10 Essential (primary) hypertension: Secondary | ICD-10-CM | POA: Diagnosis not present

## 2016-10-26 DIAGNOSIS — E789 Disorder of lipoprotein metabolism, unspecified: Secondary | ICD-10-CM | POA: Diagnosis not present

## 2016-10-26 DIAGNOSIS — E118 Type 2 diabetes mellitus with unspecified complications: Secondary | ICD-10-CM | POA: Diagnosis not present

## 2016-12-24 DIAGNOSIS — L2089 Other atopic dermatitis: Secondary | ICD-10-CM | POA: Diagnosis not present

## 2017-01-18 DIAGNOSIS — E118 Type 2 diabetes mellitus with unspecified complications: Secondary | ICD-10-CM | POA: Diagnosis not present

## 2017-01-18 DIAGNOSIS — I10 Essential (primary) hypertension: Secondary | ICD-10-CM | POA: Diagnosis not present

## 2017-01-25 DIAGNOSIS — N289 Disorder of kidney and ureter, unspecified: Secondary | ICD-10-CM | POA: Diagnosis not present

## 2017-01-25 DIAGNOSIS — I10 Essential (primary) hypertension: Secondary | ICD-10-CM | POA: Diagnosis not present

## 2017-01-25 DIAGNOSIS — E118 Type 2 diabetes mellitus with unspecified complications: Secondary | ICD-10-CM | POA: Diagnosis not present

## 2017-02-15 DIAGNOSIS — I10 Essential (primary) hypertension: Secondary | ICD-10-CM | POA: Diagnosis not present

## 2017-02-15 DIAGNOSIS — E118 Type 2 diabetes mellitus with unspecified complications: Secondary | ICD-10-CM | POA: Diagnosis not present

## 2017-04-19 DIAGNOSIS — Z79899 Other long term (current) drug therapy: Secondary | ICD-10-CM | POA: Diagnosis not present

## 2017-04-19 DIAGNOSIS — I10 Essential (primary) hypertension: Secondary | ICD-10-CM | POA: Diagnosis not present

## 2017-04-26 DIAGNOSIS — Z6824 Body mass index (BMI) 24.0-24.9, adult: Secondary | ICD-10-CM | POA: Diagnosis not present

## 2017-04-26 DIAGNOSIS — R350 Frequency of micturition: Secondary | ICD-10-CM | POA: Diagnosis not present

## 2017-04-26 DIAGNOSIS — Z1231 Encounter for screening mammogram for malignant neoplasm of breast: Secondary | ICD-10-CM | POA: Diagnosis not present

## 2017-04-26 DIAGNOSIS — N959 Unspecified menopausal and perimenopausal disorder: Secondary | ICD-10-CM | POA: Diagnosis not present

## 2017-04-28 ENCOUNTER — Other Ambulatory Visit: Payer: Self-pay | Admitting: Obstetrics & Gynecology

## 2017-04-28 DIAGNOSIS — R928 Other abnormal and inconclusive findings on diagnostic imaging of breast: Secondary | ICD-10-CM

## 2017-05-04 ENCOUNTER — Other Ambulatory Visit: Payer: Self-pay | Admitting: Obstetrics & Gynecology

## 2017-05-04 ENCOUNTER — Ambulatory Visit
Admission: RE | Admit: 2017-05-04 | Discharge: 2017-05-04 | Disposition: A | Discharge status: Home / Self Care | Payer: Medicare Other | Source: Ambulatory Visit | Attending: Obstetrics & Gynecology | Admitting: Obstetrics & Gynecology

## 2017-05-04 DIAGNOSIS — R928 Other abnormal and inconclusive findings on diagnostic imaging of breast: Secondary | ICD-10-CM

## 2017-05-04 DIAGNOSIS — R920 Mammographic microcalcification found on diagnostic imaging of breast: Secondary | ICD-10-CM | POA: Diagnosis not present

## 2017-05-04 DIAGNOSIS — N6001 Solitary cyst of right breast: Secondary | ICD-10-CM | POA: Diagnosis not present

## 2017-05-04 DIAGNOSIS — N6310 Unspecified lump in the right breast, unspecified quadrant: Secondary | ICD-10-CM

## 2017-05-23 DIAGNOSIS — E789 Disorder of lipoprotein metabolism, unspecified: Secondary | ICD-10-CM | POA: Diagnosis not present

## 2017-05-23 DIAGNOSIS — E118 Type 2 diabetes mellitus with unspecified complications: Secondary | ICD-10-CM | POA: Diagnosis not present

## 2017-05-23 DIAGNOSIS — I10 Essential (primary) hypertension: Secondary | ICD-10-CM | POA: Diagnosis not present

## 2017-05-23 DIAGNOSIS — E559 Vitamin D deficiency, unspecified: Secondary | ICD-10-CM | POA: Diagnosis not present

## 2017-05-31 DIAGNOSIS — I10 Essential (primary) hypertension: Secondary | ICD-10-CM | POA: Diagnosis not present

## 2017-05-31 DIAGNOSIS — E782 Mixed hyperlipidemia: Secondary | ICD-10-CM | POA: Diagnosis not present

## 2017-05-31 DIAGNOSIS — E507 Other ocular manifestations of vitamin A deficiency: Secondary | ICD-10-CM | POA: Diagnosis not present

## 2017-05-31 DIAGNOSIS — E118 Type 2 diabetes mellitus with unspecified complications: Secondary | ICD-10-CM | POA: Diagnosis not present

## 2017-05-31 DIAGNOSIS — E559 Vitamin D deficiency, unspecified: Secondary | ICD-10-CM | POA: Diagnosis not present

## 2017-08-15 DIAGNOSIS — E119 Type 2 diabetes mellitus without complications: Secondary | ICD-10-CM | POA: Diagnosis not present

## 2017-08-15 DIAGNOSIS — E1339 Other specified diabetes mellitus with other diabetic ophthalmic complication: Secondary | ICD-10-CM | POA: Diagnosis not present

## 2017-08-15 DIAGNOSIS — E1140 Type 2 diabetes mellitus with diabetic neuropathy, unspecified: Secondary | ICD-10-CM | POA: Diagnosis not present

## 2017-08-24 DIAGNOSIS — E118 Type 2 diabetes mellitus with unspecified complications: Secondary | ICD-10-CM | POA: Diagnosis not present

## 2017-08-24 DIAGNOSIS — E559 Vitamin D deficiency, unspecified: Secondary | ICD-10-CM | POA: Diagnosis not present

## 2017-08-24 DIAGNOSIS — E119 Type 2 diabetes mellitus without complications: Secondary | ICD-10-CM | POA: Diagnosis not present

## 2017-08-31 DIAGNOSIS — E876 Hypokalemia: Secondary | ICD-10-CM | POA: Diagnosis not present

## 2017-08-31 DIAGNOSIS — E782 Mixed hyperlipidemia: Secondary | ICD-10-CM | POA: Diagnosis not present

## 2017-08-31 DIAGNOSIS — E8809 Other disorders of plasma-protein metabolism, not elsewhere classified: Secondary | ICD-10-CM | POA: Diagnosis not present

## 2017-08-31 DIAGNOSIS — G250 Essential tremor: Secondary | ICD-10-CM | POA: Diagnosis not present

## 2017-08-31 DIAGNOSIS — Z803 Family history of malignant neoplasm of breast: Secondary | ICD-10-CM | POA: Diagnosis not present

## 2017-08-31 DIAGNOSIS — E559 Vitamin D deficiency, unspecified: Secondary | ICD-10-CM | POA: Diagnosis not present

## 2017-08-31 DIAGNOSIS — I10 Essential (primary) hypertension: Secondary | ICD-10-CM | POA: Diagnosis not present

## 2017-09-14 DIAGNOSIS — E782 Mixed hyperlipidemia: Secondary | ICD-10-CM | POA: Diagnosis not present

## 2017-09-14 DIAGNOSIS — E559 Vitamin D deficiency, unspecified: Secondary | ICD-10-CM | POA: Diagnosis not present

## 2017-09-14 DIAGNOSIS — I10 Essential (primary) hypertension: Secondary | ICD-10-CM | POA: Diagnosis not present

## 2017-09-14 DIAGNOSIS — E118 Type 2 diabetes mellitus with unspecified complications: Secondary | ICD-10-CM | POA: Diagnosis not present

## 2017-10-18 ENCOUNTER — Encounter: Payer: Self-pay | Admitting: Neurology

## 2017-10-18 ENCOUNTER — Telehealth: Payer: Self-pay | Admitting: Neurology

## 2017-10-18 ENCOUNTER — Ambulatory Visit (INDEPENDENT_AMBULATORY_CARE_PROVIDER_SITE_OTHER): Payer: Medicare Other | Admitting: Neurology

## 2017-10-18 VITALS — BP 134/77 | HR 72 | Ht 63.0 in | Wt 137.0 lb

## 2017-10-18 DIAGNOSIS — G243 Spasmodic torticollis: Secondary | ICD-10-CM

## 2017-10-18 DIAGNOSIS — G252 Other specified forms of tremor: Secondary | ICD-10-CM | POA: Diagnosis not present

## 2017-10-18 NOTE — Telephone Encounter (Signed)
Patient states she will call back as needed for a follow-up.

## 2017-10-18 NOTE — Patient Instructions (Addendum)
You have a mildly abnormal neck position and an associated head tremor. I believe this is a different condition than a familial tremor or what we call essential tremor.   There are very few medications that symptomatically help with tremor reduction, none are without potential side effects.   Unfortunately, your head tremor may not respond to oral medication but we may be able to reduce your tremor with the help of Botox injections. I have provided you with information material on this treatment and I would like for you to think about it.   Furthermore, I would recommend a brain MRI to rule out a structural cause for your symptoms. I understand that you would like to think about this, you can always call us back.   The rest of your neurological exam is benign, which is reassuring.  At this juncture, I will see you back as needed. If you would like to proceed with Botox injections we will make a follow-up appointment for this separately as we will have to go over the details of the injection and the consent form at the time, which we will then submit to your insurance for prior authorization of this treatment.

## 2017-10-18 NOTE — Progress Notes (Signed)
Subjective:    Patient ID: Lori Michael is a 77 y.o. female.  HPI     Star Age, MD, PhD [facility] [address], [address] P[address] [place], [place] [address]  Dear Dr. Shelia Media,   I saw your patient, Lori Michael, upon your kind request in my neurologic clinic today for initial consultation of her head tremor. The patient is unaccompanied today. As you know, Lori Michael is a 77 year old right-handed woman with an underlying medical history of type 2 diabetes, hypertriglyceridemia, hypertension and allergic rhinitis, who reports a long-standing history of head tremor. It seems to get worse when she is anxious. She is often unaware of it but sometimes it flares up. She has noticed that when she holds her right hand against her chin in support of her head, the tremor seems to calm down briefly, she also tends to lean backwards against a surface which helps control the tremor. Years ago she may have tried some medication with her previous primary care physician but does not remember the name of the medication. I reviewed your office note from 08/31/2017, which you kindly included. She had recent blood work through your office in December 2018 which I reviewed, CMP was unremarkable with the exception of glucose at 186, vitamin D level was borderline low at 28, lipid panel from September 2018 showed a total cholesterol of 159, triglycerides of 267, LDL 69, BMP in your office on 08/31/2017 was indicative of increased creatinine of 1.6 and glucose level was 194. She has been on vitamin D supplementation, of note, she is on metoprolol 50 mg long-acting once daily. Her latest A1c was around 7 point something she recalls. 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 She has developed L hand dupuytren contracture. She had left carpal tunnel surgery. She reports no significant tremor history in her family but recalls that may be her father had a head  tremor. She had a total of 7 brothers and 2 sisters, one brother passed away and one sister passed away, none with tremors. She does not utilize alcohol, she is a nonsmoker, she drinks coffee very infrequently, diet soda 1 per day on average.  Her Past Medical History Is Significant For: Past Medical History:  Diagnosis Date  . Carpal tunnel syndrome on left   . Diabetes mellitus ORAL MEDS  . Fibromyalgia   . Frequency of urination   . Hypertension   . Hypertriglyceridemia   . Insomnia   . PONV (postoperative nausea and vomiting)     Her Past Surgical History Is Significant For: Past Surgical History:  Procedure Laterality Date  . CARPAL TUNNEL RELEASE  09/14/2011   Procedure: CARPAL TUNNEL RELEASE;  Surgeon: Laurice Record Aplington;  Location: [place];  Service: Orthopedics;  Laterality: Left;  . DUPUYTREN CONTRACTURE RELEASE  09/14/2011   Procedure: DUPUYTREN CONTRACTURE RELEASE;  Surgeon: Laurice Record Aplington;  Location: [facility];  Service: Orthopedics;  Laterality: Left;  . TONSILLECTOMY  AGE 10  . TOTAL ABDOMINAL HYSTERECTOMY W/ BILATERAL SALPINGOOPHORECTOMY  AGE 40    Her Family History Is Significant For: No family history on file.  Her Social History Is Significant For: Social History   Socioeconomic History  . Marital status: Married    Spouse name: None  . Number of children: None  . Years of education: None  . Highest education level: None  Social Needs  . Financial resource strain: None  .  Food insecurity - worry: None  . Food insecurity - inability: None  . Transportation needs - medical: None  . Transportation needs - non-medical: None  Occupational History  . None  Tobacco Use  . Smoking status: Never Smoker  . Smokeless tobacco: Never Used  Substance and Sexual Activity  . Alcohol use: No  . Drug use: No  . Sexual activity: None  Other Topics Concern  . None  Social History Narrative  . None    Her Allergies Are:   Allergies  Allergen Reactions  . Fentanyl   . Latex   :   Her Current Medications Are:  Outpatient Encounter Medications as of 10/18/2017  Medication Sig  . amLODipine-valsartan (EXFORGE) 5-320 MG tablet Take 1 tablet by mouth daily.  Marland Kitchen atorvastatin (LIPITOR) 20 MG tablet Take 20 mg by mouth daily.  . Dulaglutide (TRULICITY) 1.5 NW/2.9FA SOPN Inject into the skin.  . metFORMIN (GLUCOPHAGE) 500 MG tablet Take 500 mg by mouth daily with breakfast.  . metoprolol succinate (TOPROL-XL) 50 MG 24 hr tablet Take 50 mg by mouth every morning. Take with or immediately following a meal.  . triamterene-hydrochlorothiazide (MAXZIDE-25) 37.5-25 MG tablet Take 1 tablet by mouth daily.  . [DISCONTINUED] acetaminophen (TYLENOL) 500 MG tablet Take 500 mg by mouth every 6 (six) hours as needed.  . [DISCONTINUED] Estradiol (ESTRACE VA) Place 1 application vaginally once a week.  . [DISCONTINUED] GLIMEPIRIDE PO Take 1 tablet by mouth 2 (two) times daily.  . [DISCONTINUED] Telmisartan-Amlodipine 80-5 MG TABS Take by mouth.  . [DISCONTINUED] Vitamin D, Cholecalciferol, 1000 units CAPS Take 1,000 Units by mouth.   No facility-administered encounter medications on file as of 10/18/2017.   :  Review of Systems:  Out of a complete 14 point review of systems, all are reviewed and negative with the exception of these symptoms as listed below: Review of Systems  Neurological:       Patient states that she's had the tremor in her head for many years but it is getting worse.     Objective:  Neurological Exam  Physical Exam Physical Examination:   Vitals:   10/18/17 0909  BP: 134/77  Pulse: 72    General Examination: The patient is a very pleasant 77 y.o. female in no acute distress. She appears well-developed and well-nourished and well groomed.   HEENT: Normocephalic, atraumatic, pupils are equal, round and reactive to light and accommodation. Extraocular tracking is good without limitation to gaze  excursion or nystagmus noted. Normal smooth pursuit is noted. Hearing is grossly intact. Face is symmetric with normal facial animation and normal facial sensation. Speech is clear with no dysarthria noted. There is no hypophonia. There is no lip, neck/head, jaw or voice tremor. Neck is slightly difficult to move with passive range of motion. She appears to have a dystonic type of tremor, rather than a typical head tremor, no jaw tremor, no voice tremor. She has a right laterocollis, very mild left torticollis, also left shoulder is slightly higher than right, and overall slight retrocollis and overall mildly hypertrophied SCM bilaterally, also anterior neck muscles appear to be mildly hypertrophied.  She has a tendency to lean the back of her head against the wall behind her, or fit with her right hand supporting her right side of the chin. Airway examination is benign, tongue moves normally, palate elevates symmetrically.  Chest: Clear to auscultation without wheezing, rhonchi or crackles noted.  Heart: S1+S2+0, regular and normal without murmurs, rubs or gallops noted.  Abdomen: Soft, non-tender and non-distended with normal bowel sounds appreciated on auscultation.  Extremities: There is no pitting edema in the distal lower extremities bilaterally. Pedal pulses are intact.  Skin: Warm and dry without trophic changes noted.  Musculoskeletal: exam reveals no obvious joint deformities, tenderness or joint swelling or erythema.   Neurologically:  Mental status: The patient is awake, alert and oriented in all 4 spheres. Her immediate and remote memory, attention, language skills and fund of knowledge are appropriate. There is no evidence of aphasia, agnosia, apraxia or anomia. Speech is clear with normal prosody and enunciation. Thought process is linear. Mood is normal and affect is normal.  Cranial nerves II - XII are as described above under HEENT exam. In addition: shoulder shrug is normal with  equal shoulder height noted, but shoulder position at rest is unequal, left shoulder higher than right. Motor exam: Normal bulk, strength and tone is noted. There is no drift, resting tremor or rebound.  On 10/18/2017: On Archimedes spiral drawing she has no significant tremor, little difficulty with her nondominant left hand, handwriting is very legible, not tremulous, not micrographic. Romberg is negative. Reflexes are 2+ throughout. Babinski: Toes are flexor bilaterally. Fine motor skills and coordination: intact with normal finger taps, normal hand movements, normal rapid alternating patting, normal foot taps and normal foot agility.  Cerebellar testing: No dysmetria or intention tremor on finger to nose testing. Heel to shin is unremarkable bilaterally. There is no truncal or gait ataxia.  Sensory exam: intact to light touch in the upper and lower extremities.  Gait, station and balance: She stands easily. No veering to one side is noted. No leaning to one side is noted. Posture is age-appropriate and stance is narrow based. Gait shows normal stride length and normal pace. No problems turning are noted.    Assessment and Plan:    In summary, Lori Michael is a very pleasant 77 y.o.-year old female with an underlying medical history of type 2 diabetes, hypertriglyceridemia, hypertension and allergic rhinitis, who presents for neurologic consultation of her long-standing history of head tremor. 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 I talked to the patient at length today about her condition. I would like to proceed with a brain MRI to rule out a structural cause of her symptoms or a prior stroke. She reports that she has a lot going on currently, she has dental appointments and need for tooth extractions and will get implants after that. Her neurological exam otherwise is nonfocal, no evidence of hand tremors. She does not have a telltale family  history of tremors. reports a long-standing history of head tremor. She would like to think about the MRI. She is encouraged to call us if she is willing to proceed. Furthermore, for symptomatic treatment I suggested Botox injections. I talked to her at length about this treatment modality. Also provided written information material. She is willing to think about it but would like to read up on it some more. I would not suggest any oral medication at this time. Of note, she may have tried some medication in the distant past but is not sure of the name. She is currently on a beta blocker. As it stands, I will see her back on an as-needed basis. She is encouraged to call if she is interested in proceeding with Botox injections and she is also encouraged to call if she is ready to pursue a brain MRI. I  answered all her questions today and she was in agreement with the plan.  Thank you very much for allowing me to participate in the care of this nice patient. If I can be of any further assistance to you please do not hesitate to call me at [phone].  Sincerely,   Star Age, MD, PhD

## 2017-10-31 ENCOUNTER — Ambulatory Visit
Admission: RE | Admit: 2017-10-31 | Discharge: 2017-10-31 | Disposition: A | Discharge status: Home / Self Care | Payer: Medicare Other | Source: Ambulatory Visit | Attending: Obstetrics & Gynecology | Admitting: Obstetrics & Gynecology

## 2017-10-31 ENCOUNTER — Other Ambulatory Visit: Payer: Self-pay | Admitting: Obstetrics & Gynecology

## 2017-10-31 DIAGNOSIS — N6310 Unspecified lump in the right breast, unspecified quadrant: Secondary | ICD-10-CM

## 2017-11-14 DIAGNOSIS — E538 Deficiency of other specified B group vitamins: Secondary | ICD-10-CM | POA: Diagnosis not present

## 2017-11-14 DIAGNOSIS — I10 Essential (primary) hypertension: Secondary | ICD-10-CM | POA: Diagnosis not present

## 2017-11-14 DIAGNOSIS — E118 Type 2 diabetes mellitus with unspecified complications: Secondary | ICD-10-CM | POA: Diagnosis not present

## 2018-01-28 HISTORY — PX: OTHER SURGICAL HISTORY: SHX169

## 2018-01-31 DIAGNOSIS — E113311 Type 2 diabetes mellitus with moderate nonproliferative diabetic retinopathy with macular edema, right eye: Secondary | ICD-10-CM | POA: Diagnosis not present

## 2018-01-31 DIAGNOSIS — H26493 Other secondary cataract, bilateral: Secondary | ICD-10-CM | POA: Diagnosis not present

## 2018-01-31 DIAGNOSIS — H47213 Primary optic atrophy, bilateral: Secondary | ICD-10-CM | POA: Diagnosis not present

## 2018-02-10 DIAGNOSIS — H18413 Arcus senilis, bilateral: Secondary | ICD-10-CM | POA: Diagnosis not present

## 2018-02-10 DIAGNOSIS — Z961 Presence of intraocular lens: Secondary | ICD-10-CM | POA: Diagnosis not present

## 2018-02-10 DIAGNOSIS — E119 Type 2 diabetes mellitus without complications: Secondary | ICD-10-CM | POA: Diagnosis not present

## 2018-02-10 DIAGNOSIS — H26491 Other secondary cataract, right eye: Secondary | ICD-10-CM | POA: Diagnosis not present

## 2018-02-14 DIAGNOSIS — I10 Essential (primary) hypertension: Secondary | ICD-10-CM | POA: Diagnosis not present

## 2018-02-14 DIAGNOSIS — E1140 Type 2 diabetes mellitus with diabetic neuropathy, unspecified: Secondary | ICD-10-CM | POA: Diagnosis not present

## 2018-02-14 DIAGNOSIS — E538 Deficiency of other specified B group vitamins: Secondary | ICD-10-CM | POA: Diagnosis not present

## 2018-02-14 DIAGNOSIS — E782 Mixed hyperlipidemia: Secondary | ICD-10-CM | POA: Diagnosis not present

## 2018-02-28 DIAGNOSIS — H26492 Other secondary cataract, left eye: Secondary | ICD-10-CM | POA: Diagnosis not present

## 2018-02-28 DIAGNOSIS — Z961 Presence of intraocular lens: Secondary | ICD-10-CM | POA: Diagnosis not present

## 2018-05-03 ENCOUNTER — Ambulatory Visit
Admission: RE | Admit: 2018-05-03 | Discharge: 2018-05-03 | Disposition: A | Discharge status: Home / Self Care | Payer: Medicare Other | Source: Ambulatory Visit | Attending: Obstetrics & Gynecology | Admitting: Obstetrics & Gynecology

## 2018-05-03 DIAGNOSIS — N6001 Solitary cyst of right breast: Secondary | ICD-10-CM | POA: Diagnosis not present

## 2018-05-03 DIAGNOSIS — N6310 Unspecified lump in the right breast, unspecified quadrant: Secondary | ICD-10-CM

## 2018-05-03 DIAGNOSIS — R928 Other abnormal and inconclusive findings on diagnostic imaging of breast: Secondary | ICD-10-CM | POA: Diagnosis not present

## 2018-05-17 DIAGNOSIS — E538 Deficiency of other specified B group vitamins: Secondary | ICD-10-CM | POA: Diagnosis not present

## 2018-05-17 DIAGNOSIS — I10 Essential (primary) hypertension: Secondary | ICD-10-CM | POA: Diagnosis not present

## 2018-05-17 DIAGNOSIS — E118 Type 2 diabetes mellitus with unspecified complications: Secondary | ICD-10-CM | POA: Diagnosis not present

## 2018-05-17 DIAGNOSIS — E782 Mixed hyperlipidemia: Secondary | ICD-10-CM | POA: Diagnosis not present

## 2018-05-17 DIAGNOSIS — N183 Chronic kidney disease, stage 3 (moderate): Secondary | ICD-10-CM | POA: Diagnosis not present

## 2018-06-02 DIAGNOSIS — E538 Deficiency of other specified B group vitamins: Secondary | ICD-10-CM | POA: Diagnosis not present

## 2018-06-02 DIAGNOSIS — E1140 Type 2 diabetes mellitus with diabetic neuropathy, unspecified: Secondary | ICD-10-CM | POA: Diagnosis not present

## 2018-06-07 DIAGNOSIS — Z23 Encounter for immunization: Secondary | ICD-10-CM | POA: Diagnosis not present

## 2018-06-07 DIAGNOSIS — E118 Type 2 diabetes mellitus with unspecified complications: Secondary | ICD-10-CM | POA: Diagnosis not present

## 2018-06-07 DIAGNOSIS — E8809 Other disorders of plasma-protein metabolism, not elsewhere classified: Secondary | ICD-10-CM | POA: Diagnosis not present

## 2018-06-07 DIAGNOSIS — E559 Vitamin D deficiency, unspecified: Secondary | ICD-10-CM | POA: Diagnosis not present

## 2018-06-07 DIAGNOSIS — E782 Mixed hyperlipidemia: Secondary | ICD-10-CM | POA: Diagnosis not present

## 2018-06-07 DIAGNOSIS — E876 Hypokalemia: Secondary | ICD-10-CM | POA: Diagnosis not present

## 2018-06-07 DIAGNOSIS — E1140 Type 2 diabetes mellitus with diabetic neuropathy, unspecified: Secondary | ICD-10-CM | POA: Diagnosis not present

## 2018-06-07 DIAGNOSIS — G250 Essential tremor: Secondary | ICD-10-CM | POA: Diagnosis not present

## 2018-06-07 DIAGNOSIS — G243 Spasmodic torticollis: Secondary | ICD-10-CM | POA: Diagnosis not present

## 2018-06-07 DIAGNOSIS — I10 Essential (primary) hypertension: Secondary | ICD-10-CM | POA: Diagnosis not present

## 2018-09-04 ENCOUNTER — Encounter: Payer: Self-pay | Admitting: Neurology

## 2018-09-04 ENCOUNTER — Telehealth: Payer: Self-pay | Admitting: Neurology

## 2018-09-04 ENCOUNTER — Ambulatory Visit (INDEPENDENT_AMBULATORY_CARE_PROVIDER_SITE_OTHER): Payer: Medicare Other | Admitting: Neurology

## 2018-09-04 VITALS — BP 153/76 | HR 65 | Ht 63.0 in | Wt 134.0 lb

## 2018-09-04 DIAGNOSIS — G252 Other specified forms of tremor: Secondary | ICD-10-CM | POA: Diagnosis not present

## 2018-09-04 DIAGNOSIS — G243 Spasmodic torticollis: Secondary | ICD-10-CM | POA: Diagnosis not present

## 2018-09-04 NOTE — Telephone Encounter (Signed)
Medicare/BCBS Supp order sent to GI pt is aware.

## 2018-09-04 NOTE — Patient Instructions (Addendum)
We are going to request insurance authorization for Botox injections for your cervical dystonia, and head tremor. We will also do a brain MRI without contrast. We will call you with the MRI results.  We will call you for your appointments for MRI and botox injections.

## 2018-09-04 NOTE — Progress Notes (Signed)
Subjective:    Patient ID: Lori Michael is a 78 y.o. female.  HPI     Interim history:   Lori Michael is a 78 year old right-handed woman with an underlying medical history of type 2 diabetes, hypertriglyceridemia, hypertension and allergic rhinitis, who presents for follow-up consultation of her dystonic head tremor and consideration for botulinum toxin injections. The patient is unaccompanied today. I first met her on 10/18/2017 at the request of her primary care physician, at which time she reported a long-standing history of head tremor. On examination, she had evidence of cervical dystonia with dystonic tremor. She was advised to proceed with a brain MRI, she declined it at the time. She was also advised to consider Botox injections and she was given information about it, she wanted to hold off.  Today, 09/04/2018: She reports no change in her tremor. She has had more stress, husband's brother has been sick, he is 22. Other than that, she follows regularly with her primary care physician and diabetes specialist. She has been told to watch her kidney function. She has also had some changes in her diabetes medicine.  The patient's allergies, current medications, family history, past medical history, past social history, past surgical history and problem list were reviewed and updated as appropriate.   Previously:   10/18/2017: (She) reports a long-standing history of head tremor. It seems to get worse when she is anxious. She is often unaware of it but sometimes it flares up. She has noticed that when she holds her right hand against her chin in support of her head, the tremor seems to calm down briefly, she also tends to lean backwards against a surface which helps control the tremor. Years ago she may have tried some medication with her previous primary care physician but does not remember the name of the medication. I reviewed your office note from 08/31/2017, which you kindly included. She had  recent blood work through your office in December 2018 which I reviewed, CMP was unremarkable with the exception of glucose at 186, vitamin D level was borderline low at 28, lipid panel from September 2018 showed a total cholesterol of 159, triglycerides of 267, LDL 69, BMP in your office on 08/31/2017 was indicative of increased creatinine of 1.6 and glucose level was 194. She has been on vitamin D supplementation, of note, she is on metoprolol 50 mg long-acting once daily. Her latest A1c was around 7 point something she recalls. 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 She has developed L hand dupuytren contracture. She had left carpal tunnel surgery. She reports no significant tremor history in her family but recalls that may be her father had a head tremor. She had a total of 7 brothers and 2 sisters, one brother passed away and one sister passed away, none with tremors. She does not utilize alcohol, she is a nonsmoker, she drinks coffee very infrequently, diet soda 1 per day on average.  Her Past Medical History Is Significant For: Past Medical History:  Diagnosis Date  . Carpal tunnel syndrome on left   . Diabetes mellitus ORAL MEDS  . Fibromyalgia   . Frequency of urination   . Hypertension   . Hypertriglyceridemia   . Insomnia   . PONV (postoperative nausea and vomiting)     Her Past Surgical History Is Significant For: Past Surgical History:  Procedure Laterality Date  . CARPAL TUNNEL RELEASE  09/14/2011   Procedure: CARPAL TUNNEL RELEASE;  Surgeon: Laurice Record Aplington;  Location: [place];  Service: Orthopedics;  Laterality: Left;  . DUPUYTREN CONTRACTURE RELEASE  09/14/2011   Procedure: DUPUYTREN CONTRACTURE RELEASE;  Surgeon: Laurice Record Aplington;  Location: [facility];  Service: Orthopedics;  Laterality: Left;  . TONSILLECTOMY  AGE 66  . TOTAL ABDOMINAL HYSTERECTOMY W/ BILATERAL SALPINGOOPHORECTOMY  AGE 48     Her Family History Is Significant For: Family History  Problem Relation Age of Onset  . Breast cancer Sister 36  . Breast cancer Sister        diagnosed in her 26's    Her Social History Is Significant For: Social History   Socioeconomic History  . Marital status: Married    Spouse name: Not on file  . Number of children: Not on file  . Years of education: Not on file  . Highest education level: Not on file  Occupational History  . Not on file  Social Needs  . Financial resource strain: Not on file  . Food insecurity:    Worry: Not on file    Inability: Not on file  . Transportation needs:    Medical: Not on file    Non-medical: Not on file  Tobacco Use  . Smoking status: Never Smoker  . Smokeless tobacco: Never Used  Substance and Sexual Activity  . Alcohol use: No  . Drug use: No  . Sexual activity: Not on file  Lifestyle  . Physical activity:    Days per week: Not on file    Minutes per session: Not on file  . Stress: Not on file  Relationships  . Social connections:    Talks on phone: Not on file    Gets together: Not on file    Attends religious service: Not on file    Active member of club or organization: Not on file    Attends meetings of clubs or organizations: Not on file    Relationship status: Not on file  Other Topics Concern  . Not on file  Social History Narrative  . Not on file    Her Allergies Are:  Allergies  Allergen Reactions  . Fentanyl   . Latex   :   Her Current Medications Are:  Outpatient Encounter Medications as of 09/04/2018  Medication Sig  . amLODipine-valsartan (EXFORGE) 5-320 MG tablet Take 1 tablet by mouth daily.  Marland Kitchen atorvastatin (LIPITOR) 20 MG tablet Take 20 mg by mouth daily.  . Dulaglutide (TRULICITY) 1.5 TI/4.5YK SOPN Inject into the skin.  . metFORMIN (GLUCOPHAGE) 500 MG tablet Take 500 mg by mouth daily with breakfast.  . metoprolol succinate (TOPROL-XL) 50 MG 24 hr tablet Take 50 mg by mouth every morning.  Take with or immediately following a meal.  . triamterene-hydrochlorothiazide (MAXZIDE-25) 37.5-25 MG tablet Take 1 tablet by mouth daily.   No facility-administered encounter medications on file as of 09/04/2018.   :  Review of Systems:  Out of a complete 14 point review of systems, all are reviewed and negative with the exception of these symptoms as listed below: Review of Systems  Neurological:       Pt presents today to discuss her head tremor. Dr. Shelia Media recommended injections to help.    Objective:  Neurological Exam  Physical Exam Physical Examination:   Vitals:   09/04/18 1017  BP: (!) 153/76  Pulse: 65    General Examination: The patient is a very pleasant 78 y.o. female in no acute distress. She appears well-developed  and well-nourished and well groomed.   HEENT: Normocephalic, atraumatic, pupils are equal, round and reactive to light and accommodation. Extraocular tracking is good without limitation to gaze excursion or nystagmus noted. Normal smooth pursuit is noted. Hearing is grossly intact. Face is symmetric with normal facial animation and normal facial sensation. Speech is clear with no dysarthria noted. There is no hypophonia. There is variable head tremor, no voice tremor, no jaw tremor. Neck is slightly difficult to move with passive range of motion. She appears to have a dystonic type of tremor, rather than a typical head tremor. She has evidence of right laterocollis, very mild left torticollis, also left shoulder is slightly higher than right, and overall slight retrocollis and overall mildly hypertrophied SCM bilaterally, also anterior neck muscles appear to be mildly hypertrophied. She has a tendency to lean the back of her head against the wall behind her, or stabilize her head w her right hand supporting her right side of the chin. Airway examination is benign, tongue moves normally, palate elevates symmetrically.  Chest: Clear to auscultation without wheezing,  rhonchi or crackles noted.  Heart: S1+S2+0, regular and normal without murmurs, rubs or gallops noted.   Abdomen: Soft, non-tender and non-distended with normal bowel sounds appreciated on auscultation.  Extremities: There is no pitting edema in the distal lower extremities bilaterally.   Skin: Warm and dry without trophic changes noted.  Musculoskeletal: exam reveals no obvious joint deformities, tenderness or joint swelling or erythema.   Neurologically:  Mental status: The patient is awake, alert and oriented in all 4 spheres. Her immediate and remote memory, attention, language skills and fund of knowledge are appropriate. There is no evidence of aphasia, agnosia, apraxia or anomia. Speech is clear with normal prosody and enunciation. Thought process is linear. Mood is normal and affect is normal.  Cranial nerves II - XII are as described above under HEENT exam. In addition: shoulder shrug is normal with equal shoulder height noted, but shoulder position at rest is unequal, left shoulder higher than right, stable.   Motor exam: Normal bulk, strength and tone is noted. There is no drift, resting tremor or rebound.  (On 10/18/2017: On Archimedes spiral drawing she has no significant tremor, little difficulty with her nondominant left hand, handwriting is very legible, not tremulous, not micrographic.)  Romberg is negative. Reflexes are 1-2+ throughout. Fine motor skills and coordination: intact with normal finger taps, normal hand movements, normal rapid alternating patting, normal foot taps and normal foot agility.  Cerebellar testing: No dysmetria or intention tremor. There is no truncal or gait ataxia.  Sensory exam: intact to light touch in the upper and lower extremities.  Gait, station and balance: She stands easily, slight upper body tilt to the right, stance is narrow based. Gait shows normal stride length and normal pace. No problems turning are noted.    Assessment and  Plan:    In summary, TAKEELA PEIL is a very pleasant 78 year old female with an underlying medical history of type 2 diabetes, hypertriglyceridemia, hypertension and allergic rhinitis, who presents for re-evaluation of her head tremor. 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 I talked to the patient again at length today. She is agreeable to pursuing a brain MRI, I will request an MRI without contrast as she had been told to watch her kidney function by her diabetes specialist, patient reports that her creatinine was elevated at one point. She has a fairly stable examination from  last year. We talked about Botox injections at length as well and informed consent was obtained today. I answered all her questions, she had good questions today. We talked about the expectations, side effects, benefits and limitations of Botox injections. She was given a copy of her signed consent and additional written information. We will pursue the MRI first and call her with her results and then hopefully we will pursue her first round of Botox injections soon. I will likely target her shoulder elevator on the left, R longissimus muscle, bilateral splenius cervices and bilateral SCMs.  I answered all her questions today and she was in agreement. 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 Pertinent laboratory and imaging test results that were available during this visit with the patient were reviewed by me and considered in my medical decision making (see chart for details).

## 2018-09-05 DIAGNOSIS — I10 Essential (primary) hypertension: Secondary | ICD-10-CM | POA: Diagnosis not present

## 2018-09-05 DIAGNOSIS — E782 Mixed hyperlipidemia: Secondary | ICD-10-CM | POA: Diagnosis not present

## 2018-09-05 DIAGNOSIS — E538 Deficiency of other specified B group vitamins: Secondary | ICD-10-CM | POA: Diagnosis not present

## 2018-09-05 DIAGNOSIS — E08311 Diabetes mellitus due to underlying condition with unspecified diabetic retinopathy with macular edema: Secondary | ICD-10-CM | POA: Diagnosis not present

## 2018-09-05 DIAGNOSIS — N183 Chronic kidney disease, stage 3 (moderate): Secondary | ICD-10-CM | POA: Diagnosis not present

## 2018-09-05 DIAGNOSIS — E118 Type 2 diabetes mellitus with unspecified complications: Secondary | ICD-10-CM | POA: Diagnosis not present

## 2018-09-05 DIAGNOSIS — E1140 Type 2 diabetes mellitus with diabetic neuropathy, unspecified: Secondary | ICD-10-CM | POA: Diagnosis not present

## 2018-09-10 ENCOUNTER — Ambulatory Visit
Admission: RE | Admit: 2018-09-10 | Discharge: 2018-09-10 | Disposition: A | Discharge status: Home / Self Care | Payer: Medicare Other | Source: Ambulatory Visit | Attending: Neurology | Admitting: Neurology

## 2018-09-10 DIAGNOSIS — G252 Other specified forms of tremor: Secondary | ICD-10-CM

## 2018-09-10 DIAGNOSIS — G243 Spasmodic torticollis: Secondary | ICD-10-CM | POA: Diagnosis not present

## 2018-09-12 ENCOUNTER — Telehealth: Payer: Self-pay

## 2018-09-12 NOTE — Progress Notes (Signed)
MRI brain wo contrast showed no acute findings; age appropriate findings were seen. No further action required on this test. Proceed with botox appt, hopefully. Please update pt.  Michel Bickers

## 2018-09-12 NOTE — Telephone Encounter (Signed)
Pt returned my call. I advised her of her MRI results and Dr. Guadelupe Sabin recommendations. I offered pt several botox appts this week and next week but she declined, and an appt was scheduled for 11/28/18 for her first botox appt. Pt verbalized understanding of results and recommendations.

## 2018-09-12 NOTE — Telephone Encounter (Signed)
-----   Message from Star Age, MD sent at 09/12/2018  7:32 AM EST ----- MRI brain wo contrast showed no acute findings; age appropriate findings were seen. No further action required on this test. Proceed with botox appt, hopefully. Please update pt.  Michel Bickers

## 2018-09-12 NOTE — Telephone Encounter (Signed)
I called pt to discuss her MRI results. No answer, left a message asking her to call me back. 

## 2018-11-28 ENCOUNTER — Ambulatory Visit: Payer: Self-pay | Admitting: Neurology

## 2018-12-04 DIAGNOSIS — E782 Mixed hyperlipidemia: Secondary | ICD-10-CM | POA: Diagnosis not present

## 2018-12-04 DIAGNOSIS — E118 Type 2 diabetes mellitus with unspecified complications: Secondary | ICD-10-CM | POA: Diagnosis not present

## 2018-12-04 DIAGNOSIS — E538 Deficiency of other specified B group vitamins: Secondary | ICD-10-CM | POA: Diagnosis not present

## 2018-12-12 DIAGNOSIS — E538 Deficiency of other specified B group vitamins: Secondary | ICD-10-CM | POA: Diagnosis not present

## 2018-12-12 DIAGNOSIS — E08311 Diabetes mellitus due to underlying condition with unspecified diabetic retinopathy with macular edema: Secondary | ICD-10-CM | POA: Diagnosis not present

## 2018-12-12 DIAGNOSIS — E118 Type 2 diabetes mellitus with unspecified complications: Secondary | ICD-10-CM | POA: Diagnosis not present

## 2018-12-12 DIAGNOSIS — I10 Essential (primary) hypertension: Secondary | ICD-10-CM | POA: Diagnosis not present

## 2018-12-12 DIAGNOSIS — E1140 Type 2 diabetes mellitus with diabetic neuropathy, unspecified: Secondary | ICD-10-CM | POA: Diagnosis not present

## 2018-12-12 DIAGNOSIS — E782 Mixed hyperlipidemia: Secondary | ICD-10-CM | POA: Diagnosis not present

## 2018-12-12 DIAGNOSIS — N183 Chronic kidney disease, stage 3 (moderate): Secondary | ICD-10-CM | POA: Diagnosis not present

## 2019-01-23 DIAGNOSIS — E118 Type 2 diabetes mellitus with unspecified complications: Secondary | ICD-10-CM | POA: Diagnosis not present

## 2019-02-13 DIAGNOSIS — E118 Type 2 diabetes mellitus with unspecified complications: Secondary | ICD-10-CM | POA: Diagnosis not present

## 2019-02-13 DIAGNOSIS — E0839 Diabetes mellitus due to underlying condition with other diabetic ophthalmic complication: Secondary | ICD-10-CM | POA: Diagnosis not present

## 2019-02-13 DIAGNOSIS — E782 Mixed hyperlipidemia: Secondary | ICD-10-CM | POA: Diagnosis not present

## 2019-02-13 DIAGNOSIS — Z7189 Other specified counseling: Secondary | ICD-10-CM | POA: Diagnosis not present

## 2019-02-13 DIAGNOSIS — N183 Chronic kidney disease, stage 3 (moderate): Secondary | ICD-10-CM | POA: Diagnosis not present

## 2019-02-13 DIAGNOSIS — Z6822 Body mass index (BMI) 22.0-22.9, adult: Secondary | ICD-10-CM | POA: Diagnosis not present

## 2019-02-13 DIAGNOSIS — I10 Essential (primary) hypertension: Secondary | ICD-10-CM | POA: Diagnosis not present

## 2019-02-13 DIAGNOSIS — E538 Deficiency of other specified B group vitamins: Secondary | ICD-10-CM | POA: Diagnosis not present

## 2019-02-13 DIAGNOSIS — E1140 Type 2 diabetes mellitus with diabetic neuropathy, unspecified: Secondary | ICD-10-CM | POA: Diagnosis not present

## 2019-03-29 ENCOUNTER — Other Ambulatory Visit: Payer: Self-pay

## 2019-05-09 DIAGNOSIS — E118 Type 2 diabetes mellitus with unspecified complications: Secondary | ICD-10-CM | POA: Diagnosis not present

## 2019-05-09 DIAGNOSIS — I10 Essential (primary) hypertension: Secondary | ICD-10-CM | POA: Diagnosis not present

## 2019-05-09 DIAGNOSIS — E782 Mixed hyperlipidemia: Secondary | ICD-10-CM | POA: Diagnosis not present

## 2019-05-11 DIAGNOSIS — E789 Disorder of lipoprotein metabolism, unspecified: Secondary | ICD-10-CM | POA: Diagnosis not present

## 2019-05-11 DIAGNOSIS — E118 Type 2 diabetes mellitus with unspecified complications: Secondary | ICD-10-CM | POA: Diagnosis not present

## 2019-05-11 DIAGNOSIS — I10 Essential (primary) hypertension: Secondary | ICD-10-CM | POA: Diagnosis not present

## 2019-05-16 DIAGNOSIS — Z6822 Body mass index (BMI) 22.0-22.9, adult: Secondary | ICD-10-CM | POA: Diagnosis not present

## 2019-05-16 DIAGNOSIS — N183 Chronic kidney disease, stage 3 (moderate): Secondary | ICD-10-CM | POA: Diagnosis not present

## 2019-05-16 DIAGNOSIS — E11319 Type 2 diabetes mellitus with unspecified diabetic retinopathy without macular edema: Secondary | ICD-10-CM | POA: Diagnosis not present

## 2019-05-16 DIAGNOSIS — E118 Type 2 diabetes mellitus with unspecified complications: Secondary | ICD-10-CM | POA: Diagnosis not present

## 2019-05-16 DIAGNOSIS — I10 Essential (primary) hypertension: Secondary | ICD-10-CM | POA: Diagnosis not present

## 2019-05-16 DIAGNOSIS — E538 Deficiency of other specified B group vitamins: Secondary | ICD-10-CM | POA: Diagnosis not present

## 2019-05-16 DIAGNOSIS — E782 Mixed hyperlipidemia: Secondary | ICD-10-CM | POA: Diagnosis not present

## 2019-05-16 DIAGNOSIS — Z7189 Other specified counseling: Secondary | ICD-10-CM | POA: Diagnosis not present

## 2019-05-16 DIAGNOSIS — E1140 Type 2 diabetes mellitus with diabetic neuropathy, unspecified: Secondary | ICD-10-CM | POA: Diagnosis not present

## 2019-06-07 DIAGNOSIS — I10 Essential (primary) hypertension: Secondary | ICD-10-CM | POA: Diagnosis not present

## 2019-06-07 DIAGNOSIS — E118 Type 2 diabetes mellitus with unspecified complications: Secondary | ICD-10-CM | POA: Diagnosis not present

## 2019-06-07 DIAGNOSIS — N390 Urinary tract infection, site not specified: Secondary | ICD-10-CM | POA: Diagnosis not present

## 2019-06-12 DIAGNOSIS — Z23 Encounter for immunization: Secondary | ICD-10-CM | POA: Diagnosis not present

## 2019-06-12 DIAGNOSIS — G250 Essential tremor: Secondary | ICD-10-CM | POA: Diagnosis not present

## 2019-06-12 DIAGNOSIS — E782 Mixed hyperlipidemia: Secondary | ICD-10-CM | POA: Diagnosis not present

## 2019-06-12 DIAGNOSIS — I10 Essential (primary) hypertension: Secondary | ICD-10-CM | POA: Diagnosis not present

## 2019-06-12 DIAGNOSIS — N183 Chronic kidney disease, stage 3 unspecified: Secondary | ICD-10-CM | POA: Diagnosis not present

## 2019-06-12 DIAGNOSIS — Z0000 Encounter for general adult medical examination without abnormal findings: Secondary | ICD-10-CM | POA: Diagnosis not present

## 2019-06-12 DIAGNOSIS — E559 Vitamin D deficiency, unspecified: Secondary | ICD-10-CM | POA: Diagnosis not present

## 2019-06-12 DIAGNOSIS — E1140 Type 2 diabetes mellitus with diabetic neuropathy, unspecified: Secondary | ICD-10-CM | POA: Diagnosis not present

## 2019-08-08 DIAGNOSIS — E538 Deficiency of other specified B group vitamins: Secondary | ICD-10-CM | POA: Diagnosis not present

## 2019-08-08 DIAGNOSIS — E782 Mixed hyperlipidemia: Secondary | ICD-10-CM | POA: Diagnosis not present

## 2019-08-08 DIAGNOSIS — I10 Essential (primary) hypertension: Secondary | ICD-10-CM | POA: Diagnosis not present

## 2019-08-08 DIAGNOSIS — E118 Type 2 diabetes mellitus with unspecified complications: Secondary | ICD-10-CM | POA: Diagnosis not present

## 2019-09-18 DIAGNOSIS — N183 Chronic kidney disease, stage 3 unspecified: Secondary | ICD-10-CM | POA: Diagnosis not present

## 2019-11-02 DIAGNOSIS — E113291 Type 2 diabetes mellitus with mild nonproliferative diabetic retinopathy without macular edema, right eye: Secondary | ICD-10-CM | POA: Diagnosis not present

## 2019-11-02 DIAGNOSIS — Z961 Presence of intraocular lens: Secondary | ICD-10-CM | POA: Diagnosis not present

## 2019-11-02 DIAGNOSIS — H524 Presbyopia: Secondary | ICD-10-CM | POA: Diagnosis not present

## 2019-11-02 DIAGNOSIS — H47291 Other optic atrophy, right eye: Secondary | ICD-10-CM | POA: Diagnosis not present

## 2019-11-02 DIAGNOSIS — H52222 Regular astigmatism, left eye: Secondary | ICD-10-CM | POA: Diagnosis not present

## 2019-11-20 DIAGNOSIS — E1140 Type 2 diabetes mellitus with diabetic neuropathy, unspecified: Secondary | ICD-10-CM | POA: Diagnosis not present

## 2019-11-26 DIAGNOSIS — I10 Essential (primary) hypertension: Secondary | ICD-10-CM | POA: Diagnosis not present

## 2019-11-26 DIAGNOSIS — E782 Mixed hyperlipidemia: Secondary | ICD-10-CM | POA: Diagnosis not present

## 2019-11-26 DIAGNOSIS — E1140 Type 2 diabetes mellitus with diabetic neuropathy, unspecified: Secondary | ICD-10-CM | POA: Diagnosis not present

## 2019-11-26 DIAGNOSIS — E11319 Type 2 diabetes mellitus with unspecified diabetic retinopathy without macular edema: Secondary | ICD-10-CM | POA: Diagnosis not present

## 2019-11-26 DIAGNOSIS — Z7189 Other specified counseling: Secondary | ICD-10-CM | POA: Diagnosis not present

## 2019-11-26 DIAGNOSIS — Z6822 Body mass index (BMI) 22.0-22.9, adult: Secondary | ICD-10-CM | POA: Diagnosis not present

## 2019-11-26 DIAGNOSIS — N183 Chronic kidney disease, stage 3 unspecified: Secondary | ICD-10-CM | POA: Diagnosis not present

## 2020-03-12 DIAGNOSIS — Z6822 Body mass index (BMI) 22.0-22.9, adult: Secondary | ICD-10-CM | POA: Diagnosis not present

## 2020-03-12 DIAGNOSIS — E782 Mixed hyperlipidemia: Secondary | ICD-10-CM | POA: Diagnosis not present

## 2020-03-12 DIAGNOSIS — N183 Chronic kidney disease, stage 3 unspecified: Secondary | ICD-10-CM | POA: Diagnosis not present

## 2020-03-12 DIAGNOSIS — I10 Essential (primary) hypertension: Secondary | ICD-10-CM | POA: Diagnosis not present

## 2020-03-12 DIAGNOSIS — E1140 Type 2 diabetes mellitus with diabetic neuropathy, unspecified: Secondary | ICD-10-CM | POA: Diagnosis not present

## 2020-03-12 DIAGNOSIS — E118 Type 2 diabetes mellitus with unspecified complications: Secondary | ICD-10-CM | POA: Diagnosis not present

## 2020-03-12 DIAGNOSIS — E11319 Type 2 diabetes mellitus with unspecified diabetic retinopathy without macular edema: Secondary | ICD-10-CM | POA: Diagnosis not present

## 2020-06-10 DIAGNOSIS — E118 Type 2 diabetes mellitus with unspecified complications: Secondary | ICD-10-CM | POA: Diagnosis not present

## 2020-06-10 DIAGNOSIS — I10 Essential (primary) hypertension: Secondary | ICD-10-CM | POA: Diagnosis not present

## 2020-06-17 DIAGNOSIS — E1140 Type 2 diabetes mellitus with diabetic neuropathy, unspecified: Secondary | ICD-10-CM | POA: Diagnosis not present

## 2020-06-17 DIAGNOSIS — Z0000 Encounter for general adult medical examination without abnormal findings: Secondary | ICD-10-CM | POA: Diagnosis not present

## 2020-06-17 DIAGNOSIS — Z23 Encounter for immunization: Secondary | ICD-10-CM | POA: Diagnosis not present

## 2020-06-17 DIAGNOSIS — N1831 Chronic kidney disease, stage 3a: Secondary | ICD-10-CM | POA: Diagnosis not present

## 2020-06-17 DIAGNOSIS — G243 Spasmodic torticollis: Secondary | ICD-10-CM | POA: Diagnosis not present

## 2020-06-17 DIAGNOSIS — G250 Essential tremor: Secondary | ICD-10-CM | POA: Diagnosis not present

## 2020-06-17 DIAGNOSIS — I10 Essential (primary) hypertension: Secondary | ICD-10-CM | POA: Diagnosis not present

## 2020-06-24 DIAGNOSIS — E11319 Type 2 diabetes mellitus with unspecified diabetic retinopathy without macular edema: Secondary | ICD-10-CM | POA: Diagnosis not present

## 2020-06-24 DIAGNOSIS — E1140 Type 2 diabetes mellitus with diabetic neuropathy, unspecified: Secondary | ICD-10-CM | POA: Diagnosis not present

## 2020-06-24 DIAGNOSIS — E782 Mixed hyperlipidemia: Secondary | ICD-10-CM | POA: Diagnosis not present

## 2020-06-24 DIAGNOSIS — I10 Essential (primary) hypertension: Secondary | ICD-10-CM | POA: Diagnosis not present

## 2020-06-24 DIAGNOSIS — Z6822 Body mass index (BMI) 22.0-22.9, adult: Secondary | ICD-10-CM | POA: Diagnosis not present

## 2020-06-24 DIAGNOSIS — N183 Chronic kidney disease, stage 3 unspecified: Secondary | ICD-10-CM | POA: Diagnosis not present

## 2020-06-25 ENCOUNTER — Other Ambulatory Visit: Payer: Self-pay

## 2020-06-25 ENCOUNTER — Ambulatory Visit
Admission: RE | Admit: 2020-06-25 | Discharge: 2020-06-25 | Disposition: A | Discharge status: Home / Self Care | Payer: Medicare Other | Source: Ambulatory Visit | Attending: Internal Medicine | Admitting: Internal Medicine

## 2020-06-25 ENCOUNTER — Other Ambulatory Visit: Payer: Self-pay | Admitting: Internal Medicine

## 2020-06-25 DIAGNOSIS — Z1231 Encounter for screening mammogram for malignant neoplasm of breast: Secondary | ICD-10-CM

## 2020-06-25 DIAGNOSIS — Z1212 Encounter for screening for malignant neoplasm of rectum: Secondary | ICD-10-CM | POA: Diagnosis not present

## 2020-06-25 DIAGNOSIS — Z1211 Encounter for screening for malignant neoplasm of colon: Secondary | ICD-10-CM | POA: Diagnosis not present

## 2020-09-08 DIAGNOSIS — I10 Essential (primary) hypertension: Secondary | ICD-10-CM | POA: Diagnosis not present

## 2020-09-08 DIAGNOSIS — E1140 Type 2 diabetes mellitus with diabetic neuropathy, unspecified: Secondary | ICD-10-CM | POA: Diagnosis not present

## 2020-09-22 DIAGNOSIS — I10 Essential (primary) hypertension: Secondary | ICD-10-CM | POA: Diagnosis not present

## 2020-09-22 DIAGNOSIS — N183 Chronic kidney disease, stage 3 unspecified: Secondary | ICD-10-CM | POA: Diagnosis not present

## 2020-09-22 DIAGNOSIS — E1140 Type 2 diabetes mellitus with diabetic neuropathy, unspecified: Secondary | ICD-10-CM | POA: Diagnosis not present

## 2020-09-22 DIAGNOSIS — E782 Mixed hyperlipidemia: Secondary | ICD-10-CM | POA: Diagnosis not present

## 2020-09-27 DIAGNOSIS — E7849 Other hyperlipidemia: Secondary | ICD-10-CM | POA: Diagnosis not present

## 2020-09-27 DIAGNOSIS — I129 Hypertensive chronic kidney disease with stage 1 through stage 4 chronic kidney disease, or unspecified chronic kidney disease: Secondary | ICD-10-CM | POA: Diagnosis not present

## 2020-09-27 DIAGNOSIS — N183 Chronic kidney disease, stage 3 unspecified: Secondary | ICD-10-CM | POA: Diagnosis not present

## 2020-09-27 DIAGNOSIS — E1122 Type 2 diabetes mellitus with diabetic chronic kidney disease: Secondary | ICD-10-CM | POA: Diagnosis not present

## 2020-10-27 DIAGNOSIS — E1122 Type 2 diabetes mellitus with diabetic chronic kidney disease: Secondary | ICD-10-CM | POA: Diagnosis not present

## 2020-10-27 DIAGNOSIS — N1831 Chronic kidney disease, stage 3a: Secondary | ICD-10-CM | POA: Diagnosis not present

## 2020-10-27 DIAGNOSIS — E782 Mixed hyperlipidemia: Secondary | ICD-10-CM | POA: Diagnosis not present

## 2020-10-27 DIAGNOSIS — I129 Hypertensive chronic kidney disease with stage 1 through stage 4 chronic kidney disease, or unspecified chronic kidney disease: Secondary | ICD-10-CM | POA: Diagnosis not present

## 2020-11-27 DIAGNOSIS — N183 Chronic kidney disease, stage 3 unspecified: Secondary | ICD-10-CM | POA: Diagnosis not present

## 2020-11-27 DIAGNOSIS — E1122 Type 2 diabetes mellitus with diabetic chronic kidney disease: Secondary | ICD-10-CM | POA: Diagnosis not present

## 2020-11-27 DIAGNOSIS — E7849 Other hyperlipidemia: Secondary | ICD-10-CM | POA: Diagnosis not present

## 2020-11-27 DIAGNOSIS — I129 Hypertensive chronic kidney disease with stage 1 through stage 4 chronic kidney disease, or unspecified chronic kidney disease: Secondary | ICD-10-CM | POA: Diagnosis not present

## 2020-12-22 DIAGNOSIS — E782 Mixed hyperlipidemia: Secondary | ICD-10-CM | POA: Diagnosis not present

## 2020-12-22 DIAGNOSIS — E1140 Type 2 diabetes mellitus with diabetic neuropathy, unspecified: Secondary | ICD-10-CM | POA: Diagnosis not present

## 2020-12-27 DIAGNOSIS — E1122 Type 2 diabetes mellitus with diabetic chronic kidney disease: Secondary | ICD-10-CM | POA: Diagnosis not present

## 2020-12-27 DIAGNOSIS — N183 Chronic kidney disease, stage 3 unspecified: Secondary | ICD-10-CM | POA: Diagnosis not present

## 2020-12-27 DIAGNOSIS — E7849 Other hyperlipidemia: Secondary | ICD-10-CM | POA: Diagnosis not present

## 2020-12-27 DIAGNOSIS — I129 Hypertensive chronic kidney disease with stage 1 through stage 4 chronic kidney disease, or unspecified chronic kidney disease: Secondary | ICD-10-CM | POA: Diagnosis not present

## 2020-12-29 DIAGNOSIS — E1140 Type 2 diabetes mellitus with diabetic neuropathy, unspecified: Secondary | ICD-10-CM | POA: Diagnosis not present

## 2020-12-29 DIAGNOSIS — Z6822 Body mass index (BMI) 22.0-22.9, adult: Secondary | ICD-10-CM | POA: Diagnosis not present

## 2020-12-29 DIAGNOSIS — N183 Chronic kidney disease, stage 3 unspecified: Secondary | ICD-10-CM | POA: Diagnosis not present

## 2020-12-29 DIAGNOSIS — E782 Mixed hyperlipidemia: Secondary | ICD-10-CM | POA: Diagnosis not present

## 2020-12-29 DIAGNOSIS — I10 Essential (primary) hypertension: Secondary | ICD-10-CM | POA: Diagnosis not present

## 2021-02-26 DIAGNOSIS — E7849 Other hyperlipidemia: Secondary | ICD-10-CM | POA: Diagnosis not present

## 2021-02-26 DIAGNOSIS — E1122 Type 2 diabetes mellitus with diabetic chronic kidney disease: Secondary | ICD-10-CM | POA: Diagnosis not present

## 2021-02-26 DIAGNOSIS — I129 Hypertensive chronic kidney disease with stage 1 through stage 4 chronic kidney disease, or unspecified chronic kidney disease: Secondary | ICD-10-CM | POA: Diagnosis not present

## 2021-02-26 DIAGNOSIS — N183 Chronic kidney disease, stage 3 unspecified: Secondary | ICD-10-CM | POA: Diagnosis not present

## 2021-03-29 DIAGNOSIS — I129 Hypertensive chronic kidney disease with stage 1 through stage 4 chronic kidney disease, or unspecified chronic kidney disease: Secondary | ICD-10-CM | POA: Diagnosis not present

## 2021-03-29 DIAGNOSIS — N183 Chronic kidney disease, stage 3 unspecified: Secondary | ICD-10-CM | POA: Diagnosis not present

## 2021-03-29 DIAGNOSIS — E1122 Type 2 diabetes mellitus with diabetic chronic kidney disease: Secondary | ICD-10-CM | POA: Diagnosis not present

## 2021-03-29 DIAGNOSIS — E7849 Other hyperlipidemia: Secondary | ICD-10-CM | POA: Diagnosis not present

## 2021-03-30 DIAGNOSIS — H52222 Regular astigmatism, left eye: Secondary | ICD-10-CM | POA: Diagnosis not present

## 2021-03-30 DIAGNOSIS — E113293 Type 2 diabetes mellitus with mild nonproliferative diabetic retinopathy without macular edema, bilateral: Secondary | ICD-10-CM | POA: Diagnosis not present

## 2021-03-30 DIAGNOSIS — Z961 Presence of intraocular lens: Secondary | ICD-10-CM | POA: Diagnosis not present

## 2021-03-30 DIAGNOSIS — H524 Presbyopia: Secondary | ICD-10-CM | POA: Diagnosis not present

## 2021-04-29 DIAGNOSIS — I129 Hypertensive chronic kidney disease with stage 1 through stage 4 chronic kidney disease, or unspecified chronic kidney disease: Secondary | ICD-10-CM | POA: Diagnosis not present

## 2021-04-29 DIAGNOSIS — E7849 Other hyperlipidemia: Secondary | ICD-10-CM | POA: Diagnosis not present

## 2021-04-29 DIAGNOSIS — N183 Chronic kidney disease, stage 3 unspecified: Secondary | ICD-10-CM | POA: Diagnosis not present

## 2021-04-29 DIAGNOSIS — E1122 Type 2 diabetes mellitus with diabetic chronic kidney disease: Secondary | ICD-10-CM | POA: Diagnosis not present

## 2021-05-18 DIAGNOSIS — E118 Type 2 diabetes mellitus with unspecified complications: Secondary | ICD-10-CM | POA: Diagnosis not present

## 2021-05-18 DIAGNOSIS — E782 Mixed hyperlipidemia: Secondary | ICD-10-CM | POA: Diagnosis not present

## 2021-05-25 DIAGNOSIS — E11319 Type 2 diabetes mellitus with unspecified diabetic retinopathy without macular edema: Secondary | ICD-10-CM | POA: Diagnosis not present

## 2021-05-25 DIAGNOSIS — E782 Mixed hyperlipidemia: Secondary | ICD-10-CM | POA: Diagnosis not present

## 2021-05-25 DIAGNOSIS — E1140 Type 2 diabetes mellitus with diabetic neuropathy, unspecified: Secondary | ICD-10-CM | POA: Diagnosis not present

## 2021-05-25 DIAGNOSIS — E118 Type 2 diabetes mellitus with unspecified complications: Secondary | ICD-10-CM | POA: Diagnosis not present

## 2021-05-25 DIAGNOSIS — N183 Chronic kidney disease, stage 3 unspecified: Secondary | ICD-10-CM | POA: Diagnosis not present

## 2021-05-25 DIAGNOSIS — E876 Hypokalemia: Secondary | ICD-10-CM | POA: Diagnosis not present

## 2021-05-25 DIAGNOSIS — I10 Essential (primary) hypertension: Secondary | ICD-10-CM | POA: Diagnosis not present

## 2021-05-25 DIAGNOSIS — Z6822 Body mass index (BMI) 22.0-22.9, adult: Secondary | ICD-10-CM | POA: Diagnosis not present

## 2021-05-29 DIAGNOSIS — I129 Hypertensive chronic kidney disease with stage 1 through stage 4 chronic kidney disease, or unspecified chronic kidney disease: Secondary | ICD-10-CM | POA: Diagnosis not present

## 2021-05-29 DIAGNOSIS — E7849 Other hyperlipidemia: Secondary | ICD-10-CM | POA: Diagnosis not present

## 2021-05-29 DIAGNOSIS — E1122 Type 2 diabetes mellitus with diabetic chronic kidney disease: Secondary | ICD-10-CM | POA: Diagnosis not present

## 2021-05-29 DIAGNOSIS — N183 Chronic kidney disease, stage 3 unspecified: Secondary | ICD-10-CM | POA: Diagnosis not present

## 2021-06-23 DIAGNOSIS — E782 Mixed hyperlipidemia: Secondary | ICD-10-CM | POA: Diagnosis not present

## 2021-06-23 DIAGNOSIS — E1140 Type 2 diabetes mellitus with diabetic neuropathy, unspecified: Secondary | ICD-10-CM | POA: Diagnosis not present

## 2021-06-23 DIAGNOSIS — Z0000 Encounter for general adult medical examination without abnormal findings: Secondary | ICD-10-CM | POA: Diagnosis not present

## 2021-06-23 DIAGNOSIS — Z23 Encounter for immunization: Secondary | ICD-10-CM | POA: Diagnosis not present

## 2021-06-23 DIAGNOSIS — G250 Essential tremor: Secondary | ICD-10-CM | POA: Diagnosis not present

## 2021-06-23 DIAGNOSIS — D890 Polyclonal hypergammaglobulinemia: Secondary | ICD-10-CM | POA: Diagnosis not present

## 2021-06-23 DIAGNOSIS — I10 Essential (primary) hypertension: Secondary | ICD-10-CM | POA: Diagnosis not present

## 2021-06-23 DIAGNOSIS — G252 Other specified forms of tremor: Secondary | ICD-10-CM | POA: Diagnosis not present

## 2021-06-23 DIAGNOSIS — M720 Palmar fascial fibromatosis [Dupuytren]: Secondary | ICD-10-CM | POA: Diagnosis not present

## 2021-06-23 DIAGNOSIS — N1831 Chronic kidney disease, stage 3a: Secondary | ICD-10-CM | POA: Diagnosis not present

## 2021-06-23 DIAGNOSIS — E118 Type 2 diabetes mellitus with unspecified complications: Secondary | ICD-10-CM | POA: Diagnosis not present

## 2021-06-29 DIAGNOSIS — E7849 Other hyperlipidemia: Secondary | ICD-10-CM | POA: Diagnosis not present

## 2021-06-29 DIAGNOSIS — E1122 Type 2 diabetes mellitus with diabetic chronic kidney disease: Secondary | ICD-10-CM | POA: Diagnosis not present

## 2021-06-29 DIAGNOSIS — N183 Chronic kidney disease, stage 3 unspecified: Secondary | ICD-10-CM | POA: Diagnosis not present

## 2021-06-29 DIAGNOSIS — I129 Hypertensive chronic kidney disease with stage 1 through stage 4 chronic kidney disease, or unspecified chronic kidney disease: Secondary | ICD-10-CM | POA: Diagnosis not present

## 2021-07-07 ENCOUNTER — Telehealth: Payer: Self-pay | Admitting: Hematology and Oncology

## 2021-07-07 NOTE — Telephone Encounter (Signed)
Scheduled appt per 11/7 referral. Pt is aware of appt date and time.

## 2021-07-27 ENCOUNTER — Other Ambulatory Visit: Payer: Self-pay

## 2021-07-27 ENCOUNTER — Other Ambulatory Visit: Payer: Self-pay | Admitting: *Deleted

## 2021-07-27 ENCOUNTER — Inpatient Hospital Stay: Payer: Medicare Other

## 2021-07-27 ENCOUNTER — Encounter: Payer: Self-pay | Admitting: Hematology and Oncology

## 2021-07-27 ENCOUNTER — Inpatient Hospital Stay: Payer: Medicare Other | Attending: Hematology and Oncology | Admitting: Hematology and Oncology

## 2021-07-27 ENCOUNTER — Telehealth: Payer: Self-pay | Admitting: *Deleted

## 2021-07-27 VITALS — BP 179/81 | HR 80 | Temp 96.4°F | Resp 18 | Wt 129.0 lb

## 2021-07-27 DIAGNOSIS — D472 Monoclonal gammopathy: Secondary | ICD-10-CM | POA: Diagnosis not present

## 2021-07-27 DIAGNOSIS — E119 Type 2 diabetes mellitus without complications: Secondary | ICD-10-CM | POA: Diagnosis not present

## 2021-07-27 DIAGNOSIS — Z7984 Long term (current) use of oral hypoglycemic drugs: Secondary | ICD-10-CM | POA: Diagnosis not present

## 2021-07-27 DIAGNOSIS — G250 Essential tremor: Secondary | ICD-10-CM | POA: Diagnosis not present

## 2021-07-27 DIAGNOSIS — M797 Fibromyalgia: Secondary | ICD-10-CM | POA: Insufficient documentation

## 2021-07-27 DIAGNOSIS — R55 Syncope and collapse: Secondary | ICD-10-CM | POA: Diagnosis not present

## 2021-07-27 DIAGNOSIS — E785 Hyperlipidemia, unspecified: Secondary | ICD-10-CM | POA: Diagnosis not present

## 2021-07-27 DIAGNOSIS — Z79899 Other long term (current) drug therapy: Secondary | ICD-10-CM | POA: Diagnosis not present

## 2021-07-27 DIAGNOSIS — I10 Essential (primary) hypertension: Secondary | ICD-10-CM | POA: Insufficient documentation

## 2021-07-27 LAB — CMP (CANCER CENTER ONLY)
ALT: 20 U/L (ref 0–44)
AST: 25 U/L (ref 15–41)
Albumin: 4.5 g/dL (ref 3.5–5.0)
Alkaline Phosphatase: 66 U/L (ref 38–126)
Anion gap: 12 (ref 5–15)
BUN: 31 mg/dL — ABNORMAL HIGH (ref 8–23)
CO2: 30 mmol/L (ref 22–32)
Calcium: 9.8 mg/dL (ref 8.9–10.3)
Chloride: 98 mmol/L (ref 98–111)
Creatinine: 1.62 mg/dL — ABNORMAL HIGH (ref 0.44–1.00)
GFR, Estimated: 32 mL/min — ABNORMAL LOW (ref >60)
Glucose, Bld: 130 mg/dL — ABNORMAL HIGH (ref 70–99)
Potassium: 2.6 mmol/L — CL (ref 3.5–5.1)
Sodium: 140 mmol/L (ref 135–145)
Total Bilirubin: 0.5 mg/dL (ref 0.3–1.2)
Total Protein: 8.4 g/dL — ABNORMAL HIGH (ref 6.5–8.1)

## 2021-07-27 LAB — CBC WITH DIFFERENTIAL (CANCER CENTER ONLY)
Abs Immature Granulocytes: 0.02 10*3/uL (ref 0.00–0.07)
Basophils Absolute: 0.1 10*3/uL (ref 0.0–0.1)
Basophils Relative: 1 %
Eosinophils Absolute: 0.2 10*3/uL (ref 0.0–0.5)
Eosinophils Relative: 3 %
HCT: 35.5 % — ABNORMAL LOW (ref 36.0–46.0)
Hemoglobin: 12.4 g/dL (ref 12.0–15.0)
Immature Granulocytes: 0 %
Lymphocytes Relative: 24 %
Lymphs Abs: 2.1 10*3/uL (ref 0.7–4.0)
MCH: 30.7 pg (ref 26.0–34.0)
MCHC: 34.9 g/dL (ref 30.0–36.0)
MCV: 87.9 fL (ref 80.0–100.0)
Monocytes Absolute: 0.8 10*3/uL (ref 0.1–1.0)
Monocytes Relative: 10 %
Neutro Abs: 5.2 10*3/uL (ref 1.7–7.7)
Neutrophils Relative %: 62 %
Platelet Count: 294 10*3/uL (ref 150–400)
RBC: 4.04 MIL/uL (ref 3.87–5.11)
RDW: 12 % (ref 11.5–15.5)
WBC Count: 8.4 10*3/uL (ref 4.0–10.5)
nRBC: 0 % (ref 0.0–0.2)

## 2021-07-27 LAB — LACTATE DEHYDROGENASE: LDH: 155 U/L (ref 98–192)

## 2021-07-27 NOTE — Telephone Encounter (Signed)
CRITICAL VALUE STICKER  CRITICAL VALUE:K+ 2.6  RECEIVER (on-site recipient of call): Drucie Ip, RN  DATE & TIME NOTIFIED: 07/27/21 3pm  MESSENGER (representative from lab): Pam  MD NOTIFIED: Dr. Lorenso Courier  TIME OF NOTIFICATION: 3:05 pm  RESPONSE:  acknowledged lab result

## 2021-07-27 NOTE — Progress Notes (Signed)
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  07/27/2021: establish care with Dr. 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 female with medical history significant for DM type II, HTN, HLD, and carpal tunnel syndrome who presents for evaluation of a possible monoclonal gammopathy.   On review of the previous records Lori Michael underwent blood work on 06/24/2019 during which time an SPEP was ordered.  The SPEP with IFE showed a faint band of possible free lambda monoclonal immunoglobulin.  Due to concern for these findings the patient was referred to hematology for further evaluation and management.  On exam today Lori Michael notes that she has been having recent issues with her inner ear and has had "drunk dizzy sensation".  She notes that in terms of energy she is "got none".  She has had some weight loss and is down approximately 3 pounds over the last several weeks.  She denies having any urinary issues such as change in the color of the urine or foaming of the urine.  She denies any pain in her back or bones.  She currently denies any fevers, chills, sweats, nausea, vomiting or diarrhea.  A full 10 point ROS is listed below.  On further discussion she notes that her family history is remarkable for heart attack in her mother as well as type 2 diabetes.  Her father had a stroke.  She has that she has 2 healthy children, 2 healthy grandchildren, and 2 healthy great-grandchildren.  She is a never smoker and does not take any alcohol.  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   Procedure: DUPUYTREN CONTRACTURE RELEASE;  Surgeon: Laurice Record Aplington;  Location: [facility];  Service: Orthopedics;  Laterality: Left;   TONSILLECTOMY  AGE 81   TOTAL ABDOMINAL HYSTERECTOMY W/ BILATERAL SALPINGOOPHORECTOMY  AGE 61    SOCIAL HISTORY: Social History   Socioeconomic History   Marital status: Married    Spouse name: Not on file   Number of children: Not on file   Years of education: Not on file   Highest education level: Not on file  Occupational History   Not on file  Tobacco Use   Smoking status: Never   Smokeless tobacco: Never  Substance and Sexual Activity   Alcohol use: No   Drug use: No   Sexual activity: Not on file  Other Topics Concern   Not on file  Social History Narrative   Not on file   Social Determinants of Health   Financial  Resource Strain: Not on file  Food Insecurity: Not on file  Transportation Needs: Not on file  Physical Activity: Not on file  Stress: Not on file  Social Connections: Not on file  Intimate Partner Violence: Not on file    FAMILY HISTORY: Family History  Problem Relation Age of Onset   Breast cancer Sister 20   Breast cancer Sister        diagnosed in her 76's    ALLERGIES:  is allergic to fentanyl and latex.  MEDICATIONS:  Current Outpatient Medications  Medication Sig Dispense Refill   Vitamin D, Cholecalciferol, 25 MCG  (1000 UT) CAPS Take by mouth.     amLODipine-valsartan (EXFORGE) 5-320 MG tablet Take 1 tablet by mouth daily.     Blood Glucose Monitoring Suppl (ONETOUCH VERIO FLEX SYSTEM) w/Device KIT AS DIRECTED 365 DAYS     Dulaglutide (TRULICITY) 1.5 AS/5.0NL SOPN Inject into the skin.     glipiZIDE (GLUCOTROL) 5 MG tablet Take 2.5 mg by mouth 2 (two) times daily as needed.     metoprolol succinate (TOPROL-XL) 50 MG 24 hr tablet Take 50 mg by mouth every morning. Take with or immediately following a meal.     olmesartan-hydrochlorothiazide (BENICAR HCT) 40-12.5 MG tablet Take 1 tablet by mouth daily.     rosuvastatin (CRESTOR) 5 MG tablet Take 5 mg by mouth daily.     No current facility-administered medications for this visit.    REVIEW OF SYSTEMS:   Constitutional: ( - ) fevers, ( - )  chills , ( - ) night sweats Eyes: ( - ) blurriness of vision, ( - ) double vision, ( - ) watery eyes Ears, nose, mouth, throat, and face: ( - ) mucositis, ( - ) sore throat Respiratory: ( - ) cough, ( - ) dyspnea, ( - ) wheezes Cardiovascular: ( - ) palpitation, ( - ) chest discomfort, ( - ) lower extremity swelling Gastrointestinal:  ( - ) nausea, ( - ) heartburn, ( - ) change in bowel habits Skin: ( - ) abnormal skin rashes Lymphatics: ( - ) new lymphadenopathy, ( - ) easy bruising Neurological: ( - ) numbness, ( - ) tingling, ( - ) new weaknesses Behavioral/Psych: ( - ) mood change, ( - ) new changes  All other systems were reviewed with the patient and are negative.  PHYSICAL EXAMINATION: ECOG PERFORMANCE STATUS: 0 - Asymptomatic  Vitals:   07/27/21 1411  BP: (!) 179/81  Pulse: 80  Resp: 18  Temp: (!) 96.4 F (35.8 C)  SpO2: 95%   Filed Weights   07/27/21 1411  Weight: 129 lb (58.5 kg)    GENERAL: well appearing elderly Caucasian female in NAD  SKIN: skin color, texture, turgor are normal, no rashes or significant lesions EYES: conjunctiva are pink and non-injected, sclera clear LUNGS: clear to  auscultation and percussion with normal breathing effort HEART: regular rate & rhythm and no murmurs and no lower extremity edema Musculoskeletal: no cyanosis of digits and no clubbing  PSYCH: alert & oriented x 3, fluent speech NEURO: no focal motor/sensory deficits  LABORATORY DATA:  I have reviewed the data as listed CBC Latest Ref Rng & Units 07/27/2021 09/14/2011  WBC 4.0 - 10.5 K/uL 8.4 -  Hemoglobin 12.0 - 15.0 g/dL 12.4 15.3(H)  Hematocrit 36.0 - 46.0 % 35.5(L) 45.0  Platelets 150 - 400 K/uL 294 -    CMP Latest Ref Rng & Units 07/27/2021 09/14/2011  Glucose 70 - 99 mg/dL 130(H) 181(H)  BUN 8 - 23 mg/dL 31(H) -  Creatinine 0.44 - 1.00 mg/dL 1.62(H) -  Sodium 135 - 145 mmol/L 140 144  Potassium 3.5 - 5.1 mmol/L 2.6(LL) 3.5  Chloride 98 - 111 mmol/L 98 -  CO2 22 - 32 mmol/L 30 -  Calcium 8.9 - 10.3 mg/dL 9.8 -  Total Protein 6.5 - 8.1 g/dL 8.4(H) -  Total Bilirubin 0.3 - 1.2 mg/dL 0.5 -  Alkaline Phos 38 - 126 U/L 66 -  AST 15 - 41 U/L 25 -  ALT 0 - 44 U/L 20 -   RADIOGRAPHIC STUDIES: No results found.  ASSESSMENT & PLAN Lori Michael 80 y.o. female with medical history significant for DM type II, HTN, HLD, and carpal tunnel syndrome who presents for evaluation of a possible monoclonal gammopathy.   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  On prior SPEP she was noted to have a faint free lambda light chain concerning for a monoclonal gammopathy.  Given these findings we will conduct a full work-up to include SPEP, UPEP, serum free light chains, and beta-2 microglobulin.  Additionally we will collect baseline CBC, CMP, LDH.  We will order a metastatic survey in order to assess for lytic lesions.  If there are any concerning findings in the above blood work we will pursue a bone marrow biopsy for further evaluation.  The patient and her husband voiced  understanding of this plan moving forward.  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   Orders Placed This Encounter  Procedures   DG Bone Survey Met    Standing Status:   Future    Standing Expiration Date:   07/27/2022    Order Specific Question:   Reason for Exam (SYMPTOM  OR DIAGNOSIS REQUIRED)    Answer:   MGUS, assess for lytic lesions    Order Specific Question:   Preferred imaging location?    Answer:   [facility]   CBC with Differential ([place] Only)    Standing Status:   Future    Number of Occurrences:   1    Standing Expiration Date:   07/27/2022   CMP ([place] only)    Standing Status:   Future    Number of Occurrences:   1    Standing Expiration Date:   07/27/2022   Lactate dehydrogenase (LDH)    Standing Status:   Future    Number of Occurrences:   1    Standing Expiration Date:   07/27/2022   Multiple Myeloma Panel (SPEP&IFE w/QIG)    Standing Status:   Future    Number of Occurrences:   1    Standing Expiration Date:   07/27/2022   Kappa/lambda light chains    Standing Status:   Future    Number of Occurrences:   1    Standing Expiration Date:   07/27/2022   24-Hr Ur UPEP/UIFE/Light Chains/TP    Standing Status:   Future    Number of Occurrences:   1    Standing Expiration Date:   07/27/2022   Beta 2 microglobulin    Standing  Status:   Future    Number of Occurrences:   1    Standing Expiration Date:   07/27/2022   All questions were answered. The patient knows to call the clinic with any problems, questions or concerns.  A total of more than 60 minutes were spent on this encounter with face-to-face time and non-face-to-face time,  including preparing to see the patient, ordering tests and/or medications, counseling the patient and coordination of care as outlined above.   Ledell Peoples, MD Department of Hematology/Oncology [facility] Phone: [phone] Pager: [phone] Email: [email or web address]  07/27/2021 4:35 PM

## 2021-07-28 LAB — BETA 2 MICROGLOBULIN, SERUM: Beta-2 Microglobulin: 2.9 mg/L — ABNORMAL HIGH (ref 0.6–2.4)

## 2021-07-28 LAB — KAPPA/LAMBDA LIGHT CHAINS
Kappa free light chain: 53.6 mg/L — ABNORMAL HIGH (ref 3.3–19.4)
Kappa, lambda light chain ratio: 1.77 — ABNORMAL HIGH (ref 0.26–1.65)
Lambda free light chains: 30.2 mg/L — ABNORMAL HIGH (ref 5.7–26.3)

## 2021-07-29 ENCOUNTER — Telehealth: Payer: Self-pay | Admitting: *Deleted

## 2021-07-29 MED ORDER — POTASSIUM CHLORIDE CRYS ER 20 MEQ PO TBCR: 20.0000 meq | EXTENDED_RELEASE_TABLET | Freq: Two times a day (BID) | ORAL | 0 refills | Status: DC

## 2021-07-29 NOTE — Telephone Encounter (Signed)
Received call back from pt and advised that her K+ is low @ 2.6 and hat Dr, Lorenso Courier has recommended oral potassium along with repeat abs in 2 weeks. Pt voiced understanding and will pick up her prescription today.

## 2021-07-29 NOTE — Telephone Encounter (Signed)
TCT patient No answer. Left vm message for her to return this call as soon as she can, as we need to call in Potassium prescription for her.  Script for KCL sent to her pharmacy-[facility]  Scheduling message sent for lab appt in 2 weeks

## 2021-07-29 NOTE — Telephone Encounter (Signed)
-----   Message from Orson Slick, MD sent at 07/29/2021  9:09 AM EST ----- Please let Lori Michael know that her K is 2.6. Please call in 40 meq of potassium (PO daily x 30 days) to a pharmacy of her choosing and set her up for a lab only visit in 2 week time to reassess.  ----- Message ----- From: Buel Ream, Lab In [place] Sent: 07/27/2021   3:29 PM EST To: Orson Slick, MD

## 2021-07-31 LAB — MULTIPLE MYELOMA PANEL, SERUM
Albumin SerPl Elph-Mcnc: 4 g/dL (ref 2.9–4.4)
Albumin/Glob SerPl: 1.1 (ref 0.7–1.7)
Alpha 1: 0.2 g/dL (ref 0.0–0.4)
Alpha2 Glob SerPl Elph-Mcnc: 0.9 g/dL (ref 0.4–1.0)
B-Globulin SerPl Elph-Mcnc: 1.2 g/dL (ref 0.7–1.3)
Gamma Glob SerPl Elph-Mcnc: 1.8 g/dL (ref 0.4–1.8)
Globulin, Total: 4 g/dL — ABNORMAL HIGH (ref 2.2–3.9)
IgA: 200 mg/dL (ref 64–422)
IgG (Immunoglobin G), Serum: 1684 mg/dL — ABNORMAL HIGH (ref 586–1602)
IgM (Immunoglobulin M), Srm: 190 mg/dL (ref 26–217)
Total Protein ELP: 8 g/dL (ref 6.0–8.5)

## 2021-08-02 IMAGING — MG DIGITAL SCREENING BILAT W/ TOMO W/ CAD
6 of 10 series · 6 of 30 positions shown · non-contrast
Comparison: Previous exam(s).

CLINICAL DATA: Screening.

EXAM:
DIGITAL SCREENING BILATERAL MAMMOGRAM WITH TOMO AND CAD

[L MLO synth-2D]
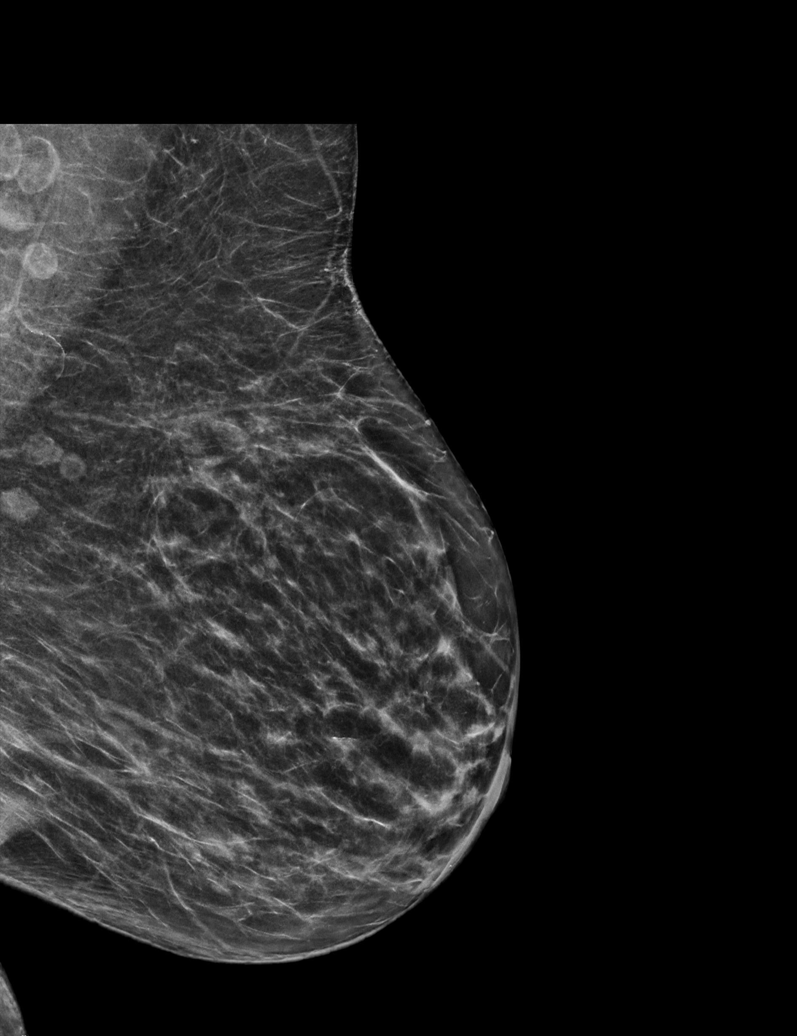

[R MLO synth-2D (1 of 2)]
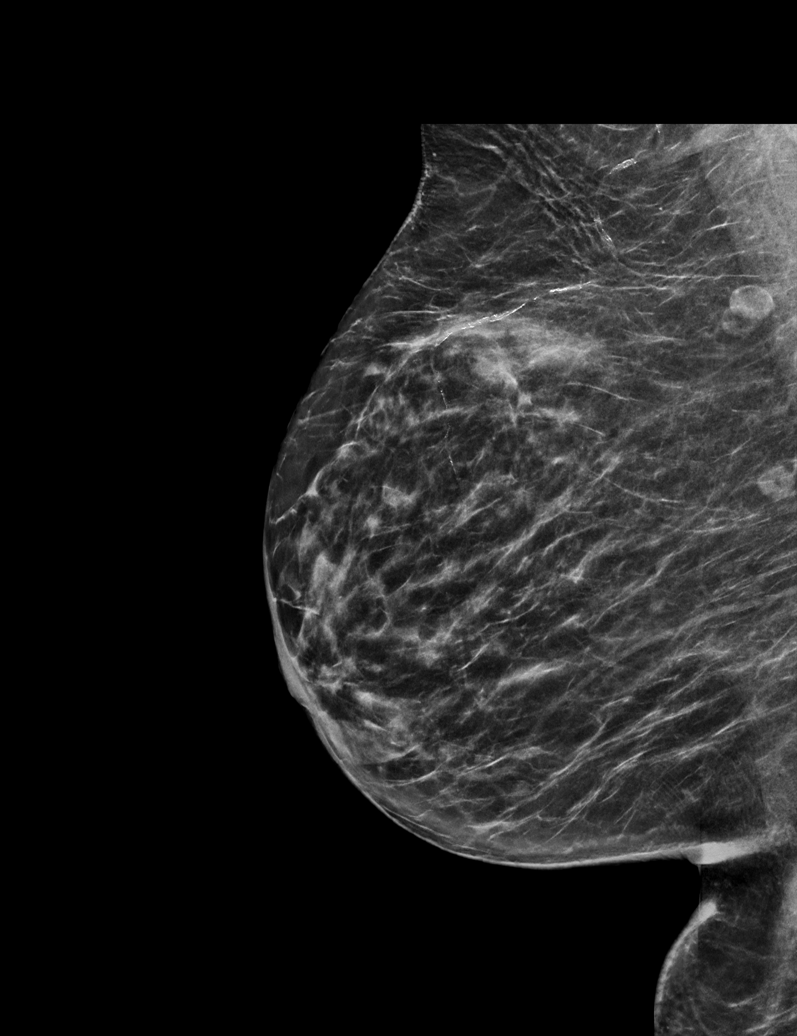

[L CC synth-2D]
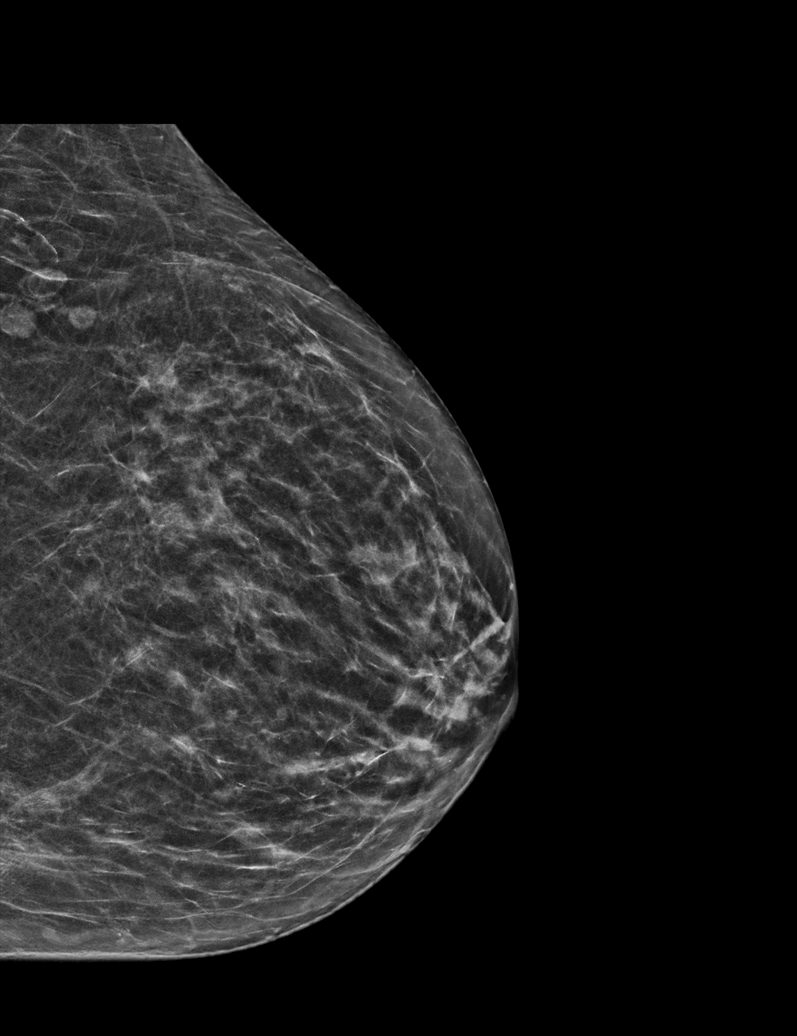

[R MLO synth-2D (2 of 2)]
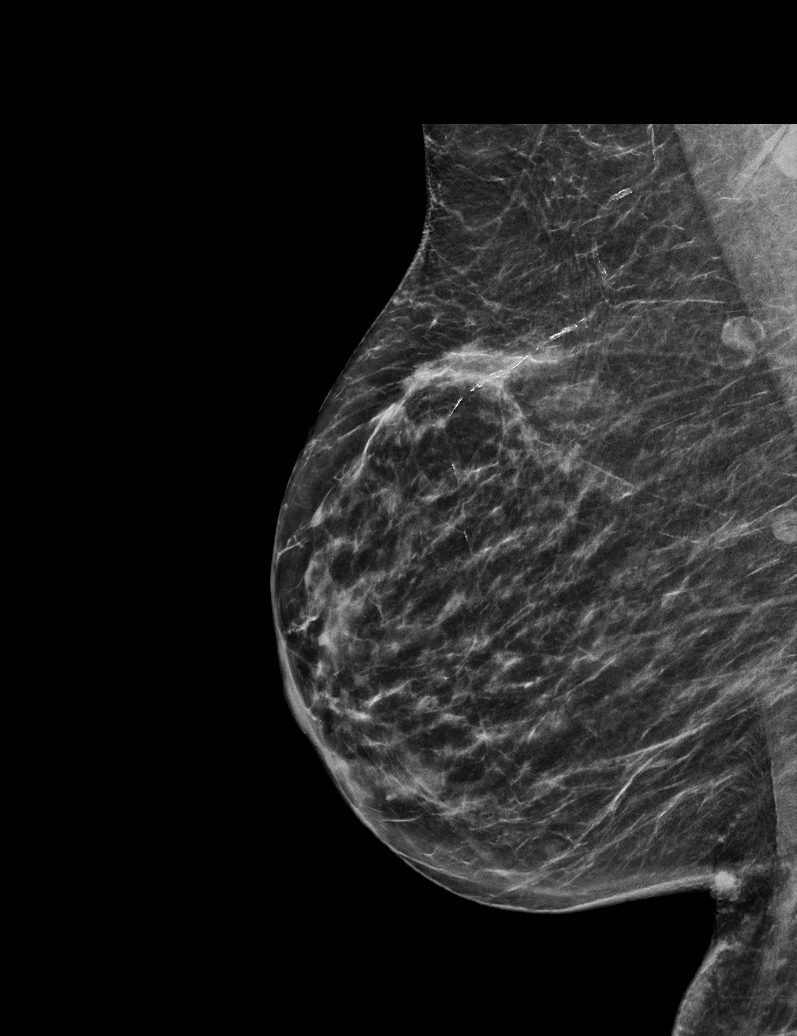

[R CC synth-2D]
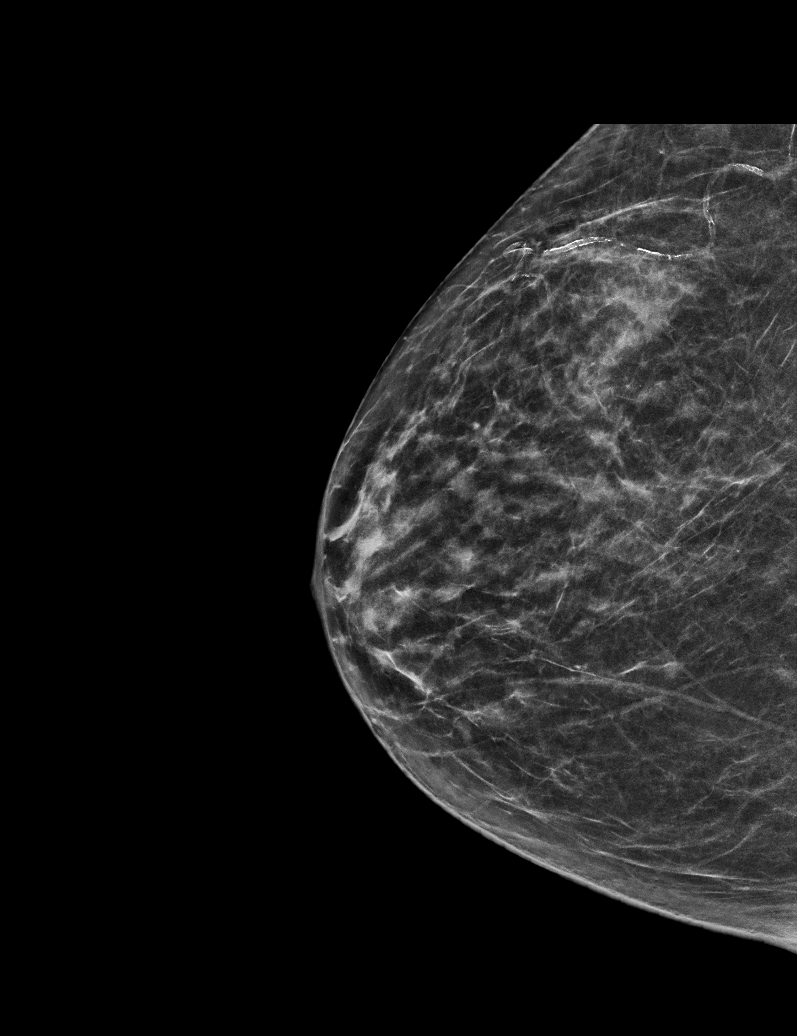

[R MLO tomo · tomo slice 28/55.0]
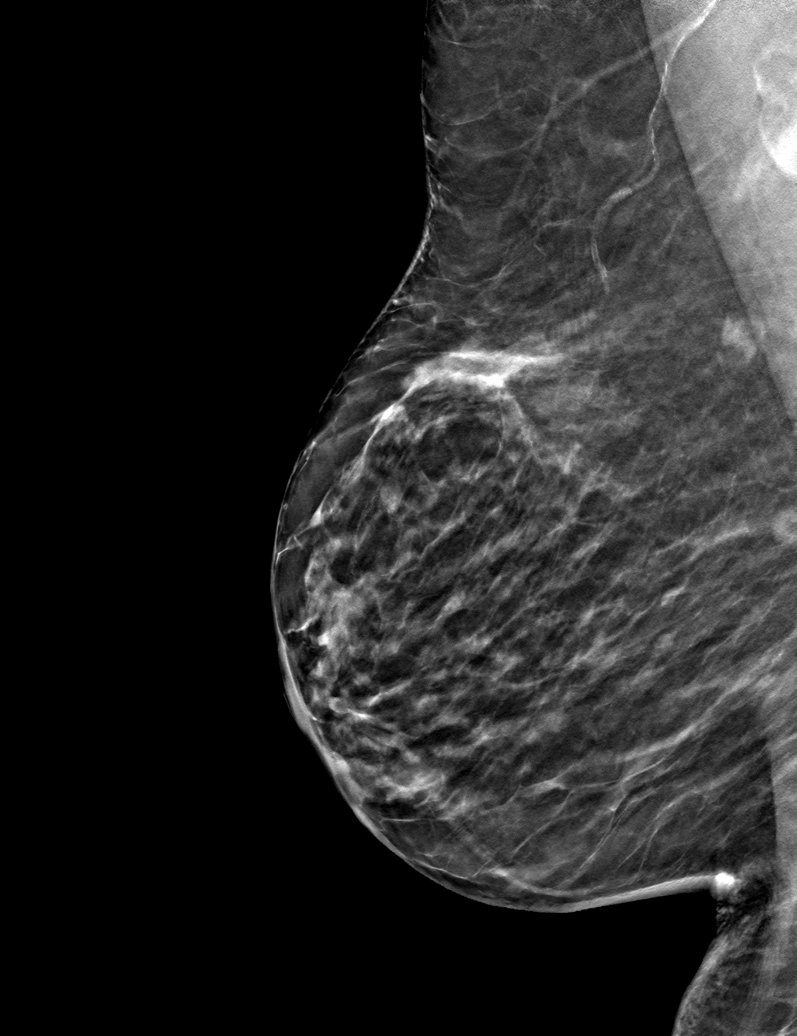

[6 of 30 positions shown; findings below may reference images not displayed]

ACR Breast Density Category b: There are scattered areas of
fibroglandular density.
FINDINGS: There are no findings suspicious for malignancy. Images were
processed with CAD.
IMPRESSION: No mammographic evidence of malignancy. A result letter of this
screening mammogram will be mailed directly to the patient.

RECOMMENDATION:
Screening mammogram in one year. (Code:[id number])

BI-RADS CATEGORY  1: Negative.

## 2021-08-05 ENCOUNTER — Other Ambulatory Visit: Payer: Self-pay

## 2021-08-05 ENCOUNTER — Ambulatory Visit (HOSPITAL_COMMUNITY)
Admission: RE | Admit: 2021-08-05 | Discharge: 2021-08-05 | Disposition: A | Discharge status: Home / Self Care | Payer: Medicare Other | Source: Ambulatory Visit | Attending: Hematology and Oncology | Admitting: Hematology and Oncology

## 2021-08-05 DIAGNOSIS — D472 Monoclonal gammopathy: Secondary | ICD-10-CM

## 2021-08-07 LAB — UPEP/UIFE/LIGHT CHAINS/TP, 24-HR UR
% BETA, Urine: 0 %
ALPHA 1 URINE: 0 %
Albumin, U: 100 %
Alpha 2, Urine: 0 %
Free Kappa Lt Chains,Ur: 112.07 mg/L — ABNORMAL HIGH (ref 1.17–86.46)
Free Kappa/Lambda Ratio: 9.32 (ref 1.83–14.26)
Free Lambda Lt Chains,Ur: 12.02 mg/L (ref 0.27–15.21)
GAMMA GLOBULIN URINE: 0 %
Total Protein, Urine-Ur/day: 111 mg/24 hr (ref 30–150)
Total Protein, Urine: 8.2 mg/dL
Total Volume: 1350

## 2021-08-12 ENCOUNTER — Other Ambulatory Visit: Payer: Self-pay

## 2021-08-12 ENCOUNTER — Other Ambulatory Visit: Payer: Self-pay | Admitting: *Deleted

## 2021-08-12 ENCOUNTER — Inpatient Hospital Stay: Payer: Medicare Other | Attending: Hematology and Oncology

## 2021-08-12 DIAGNOSIS — D472 Monoclonal gammopathy: Secondary | ICD-10-CM

## 2021-08-12 DIAGNOSIS — R55 Syncope and collapse: Secondary | ICD-10-CM | POA: Diagnosis not present

## 2021-08-12 LAB — CBC WITH DIFFERENTIAL (CANCER CENTER ONLY)
Abs Immature Granulocytes: 0.02 10*3/uL (ref 0.00–0.07)
Basophils Absolute: 0.1 10*3/uL (ref 0.0–0.1)
Basophils Relative: 1 %
Eosinophils Absolute: 0.2 10*3/uL (ref 0.0–0.5)
Eosinophils Relative: 3 %
HCT: 38 % (ref 36.0–46.0)
Hemoglobin: 13 g/dL (ref 12.0–15.0)
Immature Granulocytes: 0 %
Lymphocytes Relative: 23 %
Lymphs Abs: 1.7 10*3/uL (ref 0.7–4.0)
MCH: 30.4 pg (ref 26.0–34.0)
MCHC: 34.2 g/dL (ref 30.0–36.0)
MCV: 88.8 fL (ref 80.0–100.0)
Monocytes Absolute: 0.8 10*3/uL (ref 0.1–1.0)
Monocytes Relative: 10 %
Neutro Abs: 4.6 10*3/uL (ref 1.7–7.7)
Neutrophils Relative %: 63 %
Platelet Count: 304 10*3/uL (ref 150–400)
RBC: 4.28 MIL/uL (ref 3.87–5.11)
RDW: 12.2 % (ref 11.5–15.5)
WBC Count: 7.4 10*3/uL (ref 4.0–10.5)
nRBC: 0 % (ref 0.0–0.2)

## 2021-08-12 LAB — CMP (CANCER CENTER ONLY)
ALT: 21 U/L (ref 0–44)
AST: 26 U/L (ref 15–41)
Albumin: 4.4 g/dL (ref 3.5–5.0)
Alkaline Phosphatase: 67 U/L (ref 38–126)
Anion gap: 12 (ref 5–15)
BUN: 21 mg/dL (ref 8–23)
CO2: 29 mmol/L (ref 22–32)
Calcium: 10 mg/dL (ref 8.9–10.3)
Chloride: 99 mmol/L (ref 98–111)
Creatinine: 1.39 mg/dL — ABNORMAL HIGH (ref 0.44–1.00)
GFR, Estimated: 39 mL/min — ABNORMAL LOW (ref >60)
Glucose, Bld: 159 mg/dL — ABNORMAL HIGH (ref 70–99)
Potassium: 3.8 mmol/L (ref 3.5–5.1)
Sodium: 140 mmol/L (ref 135–145)
Total Bilirubin: 0.7 mg/dL (ref 0.3–1.2)
Total Protein: 8.6 g/dL — ABNORMAL HIGH (ref 6.5–8.1)

## 2021-09-01 ENCOUNTER — Other Ambulatory Visit: Payer: Self-pay | Admitting: Hematology and Oncology

## 2021-09-07 ENCOUNTER — Encounter: Payer: Self-pay | Admitting: *Deleted

## 2021-09-08 ENCOUNTER — Ambulatory Visit (INDEPENDENT_AMBULATORY_CARE_PROVIDER_SITE_OTHER): Payer: Medicare Other | Admitting: Neurology

## 2021-09-08 ENCOUNTER — Encounter: Payer: Self-pay | Admitting: Neurology

## 2021-09-08 VITALS — BP 166/87 | HR 74 | Ht 63.0 in | Wt 131.0 lb

## 2021-09-08 DIAGNOSIS — G243 Spasmodic torticollis: Secondary | ICD-10-CM

## 2021-09-08 DIAGNOSIS — G252 Other specified forms of tremor: Secondary | ICD-10-CM | POA: Diagnosis not present

## 2021-09-08 NOTE — Progress Notes (Signed)
Subjective:    Patient ID: Lori Michael is a 81 y.o. female.  HPI    Star Age, MD, PhD [facility] [address] [place],  [address]  Dear Dr. 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  The patient is unaccompanied today.  As you know, Lori Michael is a 81 year old right-handed woman with an underlying history hypertension, type 2 diabetes with retinopathy, hyperlipidemia, chronic kidney impairment, and allergic rhinitis, who has a longstanding history of a cervical dystonic head tremor.  I had evaluated her 3 years ago and a year prior to that and we had talked about Botox injections but she decided to hold off.  I reviewed your office note from 06/23/22.  She had blood work through your office in January 2022, at which time her blood sugar was 173, BUN 25, creatinine 1.3.  She also had blood work through your office on 12/22/2020 at which time her blood sugar was 168, BUN 32, creatinine 1.31, sodium 136, potassium 4.1, calcium 9.6, AST 19, ALT 18, alk phos 68.  She had a brain MRI without contrast on 09/10/2018 and I reviewed the results:   IMPRESSION: This MRI of the brain without contrast shows the following: 1.    Scattered T2/flair hyperintense foci in the hemispheres consistent with mild chronic microvascular ischemic change.  None of these appear to be acute. 2.    There are no acute findings.   She reports that she has been on potassium supplementation since she was found to have low potassium about a month ago.  Her level was 2.6 and she is currently on 20 meq twice daily.  She has been followed by hematology for MGUS.  She was evaluated in late November and has a follow-up pending.  She feels that she has had some improvement in her fatigue and lack of energy since she has been on potassium.  Her head tremor also flared up around that time but is now back to baseline.  She continues  to have a side-to-side head tremor and involuntary neck muscle spasms.  She is reluctant to proceed with Botox injections at this time given that she has work-up pending and a follow-up with hematology.  She would like to have some more information and closure about her hematological condition.  We talked about Botox injections at length today.  We talked about expectations, potential side effects and the muscles I would target with injections.  She was given some written information as well but would still like to hold off.  I offered her that we could go ahead with the consent and the insurance authorization but she would like to call us back for this. She has not fallen recently.  She has not had any new neurological symptoms.  Previously (copied from previous notes for reference):   09/04/2018: 81 year old right-handed woman with an underlying medical history of type 2 diabetes, hypertriglyceridemia, hypertension and allergic rhinitis, who presents for follow-up consultation of her dystonic head tremor and consideration for botulinum toxin injections. The patient is unaccompanied today. I first met her on 10/18/2017 at the request of her primary care physician, at which time she reported a long-standing history of head tremor. On examination, she had evidence of cervical dystonia with dystonic tremor. She was advised to proceed with a brain MRI, she declined it at the time. She was also advised to consider Botox injections and she was given  information about it, she wanted to hold off.   Today, 09/04/2018: She reports no change in her tremor. She has had more stress, husband's brother has been sick, he is 85. Other than that, she follows regularly with her primary care physician and diabetes specialist. She has been told to watch her kidney function. She has also had some changes in her diabetes medicine.    10/18/2017: (She) reports a long-standing history of head tremor. It seems to get worse when she  is anxious. She is often unaware of it but sometimes it flares up. She has noticed that when she holds her right hand against her chin in support of her head, the tremor seems to calm down briefly, she also tends to lean backwards against a surface which helps control the tremor. Years ago she may have tried some medication with her previous primary care physician but does not remember the name of the medication. I reviewed your office note from 08/31/2017, which you kindly included. She had recent blood work through your office in December 2018 which I reviewed, CMP was unremarkable with the exception of glucose at 186, vitamin D level was borderline low at 28, lipid panel from September 2018 showed a total cholesterol of 159, triglycerides of 267, LDL 69, BMP in your office on 08/31/2017 was indicative of increased creatinine of 1.6 and glucose level was 194. She has been on vitamin D supplementation, of note, she is on metoprolol 50 mg long-acting once daily. Her latest A1c was around 7 point something she recalls. 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 She has developed L hand dupuytren contracture. She had left carpal tunnel surgery. She reports no significant tremor history in her family but recalls that may be her father had a head tremor. She had a total of 7 brothers and 2 sisters, one brother passed away and one sister passed away, none with tremors. She does not utilize alcohol, she is a nonsmoker, she drinks coffee very infrequently, diet soda 1 per day on average.   Her Past Medical History Is Significant For: Past Medical History:  Diagnosis Date   Carpal tunnel syndrome on left    Diabetes mellitus ORAL MEDS   Fibromyalgia    Frequency of urination    Hypertension    Hypertriglyceridemia    Insomnia    PONV (postoperative nausea and vomiting)    Type 2 diabetes mellitus ([place])    with retinopathy    Her Past Surgical History Is Significant  For: Past Surgical History:  Procedure Laterality Date   bilateral eye implants  11/2013   CARPAL TUNNEL RELEASE  09/14/2011   Procedure: CARPAL TUNNEL RELEASE;  Surgeon: Laurice Record Aplington;  Location: [facility];  Service: Orthopedics;  Laterality: Left;   DUPUYTREN CONTRACTURE RELEASE  09/14/2011   Procedure: DUPUYTREN CONTRACTURE RELEASE;  Surgeon: Laurice Record Aplington;  Location: [facility];  Service: Orthopedics;  Laterality: Left;   right eye calcium removal  01/2018   TONSILLECTOMY  AGE 66   TOTAL ABDOMINAL HYSTERECTOMY W/ BILATERAL SALPINGOOPHORECTOMY  AGE 10    Her Family History Is Significant For: Family History  Problem Relation Age of Onset   Diabetes Mother    Heart attack Mother    Stroke Father    Breast cancer Sister 72   Breast cancer Sister        diagnosed in her 63's   Healthy Daughter    Healthy  Son     Her Social History Is Significant For: Social History   Socioeconomic History   Marital status: Married    Spouse name: Not on file   Number of children: Not on file   Years of education: Not on file   Highest education level: Some college, no degree  Occupational History   Not on file  Tobacco Use   Smoking status: Never   Smokeless tobacco: Never  Substance and Sexual Activity   Alcohol use: Never   Drug use: Never   Sexual activity: Not on file  Other Topics Concern   Not on file  Social History Narrative   Lives at home with husband   Retired   Caffeine: "some"   Social Determinants of Radio broadcast assistant Strain: Not on file  Food Insecurity: Not on file  Transportation Needs: Not on file  Physical Activity: Not on file  Stress: Not on file  Social Connections: Not on file    Her Allergies Are:  Allergies  Allergen Reactions   Fentanyl     Unable to move    Latex Hives and Rash    gloves  :   Her Current Medications Are:  Outpatient Encounter Medications as of 09/08/2021  Medication Sig    amLODipine (NORVASC) 5 MG tablet Take 1 tablet by mouth daily.   glipiZIDE (GLUCOTROL) 5 MG tablet Take 2.5 mg by mouth 2 (two) times daily as needed.   metoprolol succinate (TOPROL-XL) 50 MG 24 hr tablet Take 50 mg by mouth every morning. Take with or immediately following a meal.   olmesartan-hydrochlorothiazide (BENICAR HCT) 40-12.5 MG tablet Take 1 tablet by mouth daily.   potassium chloride SA (KLOR-CON M) 20 MEQ tablet TAKE 1 TABLET BY MOUTH TWICE DAILY   rosuvastatin (CRESTOR) 5 MG tablet Take 5 mg by mouth daily.   TRULICITY 3 ZL/9.3TT SOPN Inject into the skin.   Vitamin D, Cholecalciferol, 25 MCG (1000 UT) CAPS Take by mouth.   Blood Glucose Monitoring Suppl (ONETOUCH VERIO FLEX SYSTEM) w/Device KIT AS DIRECTED 365 DAYS   [DISCONTINUED] amLODipine-valsartan (EXFORGE) 5-320 MG tablet Take 1 tablet by mouth daily.   [DISCONTINUED] Dulaglutide (TRULICITY) 1.5 SV/7.7LT SOPN Inject into the skin.   No facility-administered encounter medications on file as of 09/08/2021.  :   Review of Systems:  Out of a complete 14 point review of systems, all are reviewed and negative with the exception of these symptoms as listed below:  Review of Systems  Neurological:        Patient is here for consultation for botox for cervical dystonia. She recently had hypokalemia and tremor was worse. It's better since she was treated and she feels better.    Objective:  Neurological Exam  Physical Exam Physical Examination:   Vitals:   09/08/21 1453 09/08/21 1456  BP: (!) 156/84 (!) 166/87  Pulse: 74 74    General Examination: The patient is a very pleasant 81 y.o. female in no acute distress. She appears well-developed and well-nourished and well groomed.  Good spirits.  HEENT: Normocephalic, atraumatic, pupils are equal, round and reactive to light and accommodation. Extraocular tracking is good without limitation to gaze excursion or nystagmus noted. Normal smooth pursuit is noted. Hearing is  grossly intact. Face is symmetric with normal facial animation. Speech is clear with no dysarthria noted. There is no hypophonia. There is side-to-side head tremor which is very consistent, she has evidence of neck dystonia with limited active and passive  range of motion. She has evidence of right laterocollis, very mild left torticollis, also left shoulder is higher than right, and overall slight retrocollis.  She has significantly hypertrophied SCM bilaterally, also anterior neck muscles appear to be mildly hypertrophied. She has a tendency to lean the back of her head against the wall behind her, or stabilize her head w her right or left hand, stabilizing at the chin.  This helps tone down the tremor of her head.  Airway examination is benign, tongue moves normally, palate elevates symmetrically.   Chest: Clear to auscultation without wheezing, rhonchi or crackles noted.   Heart: S1+S2+0, regular and normal without murmurs, rubs or gallops noted.    Abdomen: Soft, non-tender and non-distended.   Extremities: There is no pitting edema in the distal lower extremities bilaterally.    Skin: Warm and dry without trophic changes noted.   Musculoskeletal: exam reveals no obvious joint deformities, with the exception of bilateral Dupuytren contractures left more than right.     Neurologically:  Mental status: The patient is awake, alert and oriented in all 4 spheres. Her immediate and remote memory, attention, language skills and fund of knowledge are appropriate. There is no evidence of aphasia, agnosia, apraxia or anomia. Speech is clear with normal prosody and enunciation. Thought process is linear. Mood is normal and affect is normal.  Cranial nerves II - XII are as described above under HEENT exam. In addition: shoulder shrug is normal with equal shoulder height noted, but shoulder position at rest is unequal, left shoulder higher than right.    Motor exam: Normal bulk, strength and tone is noted.  There is no drift, resting tremor or rebound.   (On 10/18/2017: On Archimedes spiral drawing she has no significant tremor, little difficulty with her nondominant left hand, handwriting is very legible, not tremulous, not micrographic.)   Romberg is negative. Reflexes are 1+ throughout. Fine motor skills and coordination: intact with normal finger taps, normal hand movements, normal rapid alternating patting, normal foot taps and normal foot agility.  Cerebellar testing: No dysmetria or intention tremor. There is no truncal or gait ataxia.  Sensory exam: intact to light touch in the upper and lower extremities.  Gait, station and balance: She stands easily, slight upper body tilt to the right, stance is narrow based. Gait shows normal stride length and normal pace. No problems turning are noted.     Assessment and Plan:     In summary, TERRIAN RIDLON is a very pleasant 81 year old female with an underlying medical history of type 2 diabetes, hypertriglyceridemia, hypertension and allergic rhinitis, who presents for re-consultation of her dystonic head tremor.  She has a history of cervical dystonia and dystonic side-to-side head tremor for years.  Her brain MRI from January 2020 showed no acute findings, chronic findings were seen consistent with mild chronic microvascular ischemic changes.  She has evidence of cervical dystonia and a dystonic head tremor. Clinically, on my evaluation today she has become a little worse. I talked to the patient at length today about Botox injections, expectations, side effects, benefits and limitations.  Questions were answered but we did not proceed with a formal consent as she would like to hold off until she has a follow-up with her hematologist.  She has been seeing hematology for concern for MGUS.  She has been treated for hypokalemia recently.  She is encouraged to call us when she is ready to pursue Botox injections.  I would like to target her  shoulder elevator on  the left>R, R longissimus muscle, bilateral splenius cervices and capitis muscles and bilateral SCMs.  I answered all her questions today and she was in agreement.  She is advised to call us with follow-up appointment when she is ready to proceed.  Thank you very much for allowing me to participate in the care of this nice patient. If I can be of any further assistance to you please do not hesitate to call me at [phone].  Sincerely,   Star Age, MD, PhD

## 2021-09-08 NOTE — Patient Instructions (Signed)
It was nice to see you again today.  As discussed, we can proceed with insurance authorization for Botox injections for your neck dystonia and tremor, when you are ready. In the meantime, please continue to follow up with your other doctors.   Please remember, botulinum toxin takes about 3-7 days to kick in. The purpose of the injections is to gradually improve your symptoms. In some patients it takes up to 2-3 weeks to make a difference and it wears off with time. Sometimes it may wear off before it is time for the next injection. We still should wait till the next 3 monthly injection, because injecting too frequently may cause you to develop immunity to the botulinum toxin. We are looking for a reduction in the severity and/or frequency of your symptoms. As a reminder, side effects to look out for are (but not limited to): mouth dryness, dryness of the eyes, heaviness of your head or muscle weakness, including droopy face or droopy eyelid(s), rarely: speech or swallowing difficulties and very rarely: breathing difficulties. Some people have transient neck pain or soreness which typically responds to over-the-counter anti-inflammatory medication and local heat application with a heat pad. If you think you have a severe reaction to the botulinum toxin, such as weakness, trouble speaking, trouble breathing, or trouble swallowing, you have to call 911 or have someone take you to the nearest emergency room. However, most people have either no or minimal side effects from the injections. It is normal to have a little bit of redness and swelling around the injection sites which usually improves after a few hours. Rarely, there may be a bruise that improves on its own. Most side effects reported are very mild and resolve within 10-14 days. Please feel free to call us if you have any additional questions or concerns: [phone] or email [place] through My Chart. We may have to adjust the dose over time, depending on your  results from this injection and your overall response over time to this medication.

## 2021-09-15 ENCOUNTER — Telehealth: Payer: Self-pay | Admitting: *Deleted

## 2021-09-15 DIAGNOSIS — G243 Spasmodic torticollis: Secondary | ICD-10-CM

## 2021-09-15 DIAGNOSIS — G252 Other specified forms of tremor: Secondary | ICD-10-CM

## 2021-09-15 NOTE — Telephone Encounter (Signed)
We will use Xeomin if possible, approximately 200 units.  Diagnosis codes for cervical dystonia and dystonic tremor needed.

## 2021-09-15 NOTE — Telephone Encounter (Signed)
Noted thanks. I have the charge sheet completed and its ready for Dr Guadelupe Sabin signature.

## 2021-09-15 NOTE — Telephone Encounter (Signed)
Patient came by the office. She would like to move forward with the Botox injections.  She thought she had to sign a paper for Medicare but I advised our office will start the process and call to get her scheduled and then she will need to sign a consent at the first injection appointment.  Patient verbalized appreciation.

## 2021-09-16 NOTE — Telephone Encounter (Signed)
Received charge sheet for 200 units of Xeomin (W2956) for G24.3 and G25.2 (CPT [id number], [id number]). Patient has Medicare so no authorization required. Called patient and scheduled appt for 03/1.

## 2021-09-22 DIAGNOSIS — D472 Monoclonal gammopathy: Secondary | ICD-10-CM | POA: Diagnosis not present

## 2021-09-22 DIAGNOSIS — E11319 Type 2 diabetes mellitus with unspecified diabetic retinopathy without macular edema: Secondary | ICD-10-CM | POA: Diagnosis not present

## 2021-09-22 DIAGNOSIS — E782 Mixed hyperlipidemia: Secondary | ICD-10-CM | POA: Diagnosis not present

## 2021-09-22 DIAGNOSIS — I10 Essential (primary) hypertension: Secondary | ICD-10-CM | POA: Diagnosis not present

## 2021-09-22 DIAGNOSIS — M810 Age-related osteoporosis without current pathological fracture: Secondary | ICD-10-CM | POA: Diagnosis not present

## 2021-09-22 DIAGNOSIS — N1831 Chronic kidney disease, stage 3a: Secondary | ICD-10-CM | POA: Diagnosis not present

## 2021-09-22 DIAGNOSIS — E118 Type 2 diabetes mellitus with unspecified complications: Secondary | ICD-10-CM | POA: Diagnosis not present

## 2021-09-22 DIAGNOSIS — Z6822 Body mass index (BMI) 22.0-22.9, adult: Secondary | ICD-10-CM | POA: Diagnosis not present

## 2021-09-22 DIAGNOSIS — E1140 Type 2 diabetes mellitus with diabetic neuropathy, unspecified: Secondary | ICD-10-CM | POA: Diagnosis not present

## 2021-09-28 ENCOUNTER — Telehealth: Payer: Self-pay

## 2021-09-28 NOTE — Telephone Encounter (Signed)
Pt left a message requesting to review her bone scan results from 08/05/21.

## 2021-10-05 ENCOUNTER — Other Ambulatory Visit: Payer: Self-pay | Admitting: Hematology and Oncology

## 2021-10-06 DIAGNOSIS — M810 Age-related osteoporosis without current pathological fracture: Secondary | ICD-10-CM | POA: Diagnosis not present

## 2021-10-06 DIAGNOSIS — N1832 Chronic kidney disease, stage 3b: Secondary | ICD-10-CM | POA: Diagnosis not present

## 2021-10-28 ENCOUNTER — Ambulatory Visit (INDEPENDENT_AMBULATORY_CARE_PROVIDER_SITE_OTHER): Payer: Medicare Other | Admitting: Neurology

## 2021-10-28 ENCOUNTER — Other Ambulatory Visit: Payer: Self-pay

## 2021-10-28 VITALS — BP 145/76 | HR 76

## 2021-10-28 DIAGNOSIS — G252 Other specified forms of tremor: Secondary | ICD-10-CM

## 2021-10-28 DIAGNOSIS — G243 Spasmodic torticollis: Secondary | ICD-10-CM

## 2021-10-28 MED ORDER — INCOBOTULINUMTOXINA 100 UNITS IM SOLR
200.0000 [IU] | INTRAMUSCULAR | Status: AC
  Administered 2021-10-28: 200 [IU] via INTRAMUSCULAR

## 2021-10-28 NOTE — Patient Instructions (Signed)
Please remember, botulinum toxin takes about 3-7 days to kick in. As discussed, this is not a pain shot. The purpose of the injections is to gradually improve your symptoms. In some patients it takes up to 2-3 weeks to make a difference and it wears off with time. Sometimes it may wear off before it is time for the next injection. We still should wait till the next 3 monthly injection, because injecting too frequently may cause you to develop immunity to the botulinum toxin. We are looking for a reduction in the severity and/or frequency of your symptoms. As a reminder, side effects to look out for are (but not limited to): mouth dryness, dryness of the eyes, heaviness of your head or muscle weakness, including droopy face or droopy eyelid(s), rarely: speech or swallowing difficulties and very rarely: breathing difficulties. Some people have transient neck pain or soreness which typically responds to over-the-counter anti-inflammatory medication and local heat application with a heat pad. If you think you have a severe reaction to the botulinum toxin, such as weakness, trouble speaking, trouble breathing, or trouble swallowing, you have to call 911 or have someone take you to the nearest emergency room. However, most people have either no or minimal side effects from the injections. It is normal to have a little bit of redness and swelling around the injection sites which usually improves after a few hours. Rarely, there may be a bruise that improves on its own. Most side effects reported are very mild and resolve within 10-14 days. Please feel free to call us if you have any additional questions or concerns: [phone] or email us through My Chart. We may have to adjust the dose over time, depending on your results from this injection and your overall response over time to this medication.   

## 2021-10-28 NOTE — Progress Notes (Signed)
Subjective:    Patient ID: Lori Michael is a 81 y.o. female.  HPI    Lori Michael is a 81 year old right-handed woman with an underlying history hypertension, type 2 diabetes with retinopathy, hyperlipidemia, chronic kidney impairment, and allergic rhinitis, who presents for her initial botulinum toxin injection for the diagnosis of cervical dystonia (CD, aka spasmodic torticollis). The patient is unaccompanied today.   Lori Michael has a longstanding history of cervical dystonia and dystonic head tremor.  I last saw her on 09/08/2021, please refer to previous visit details regarding her history and examination.  O/E: BP (!) 145/76    Pulse 76  Lori Michael has evidence of right lateral Collis, mild left torticollis, left shoulder elevation, slight retrocollis, bilateral SCM hypertrophy and a side-to-side head tremor.   Written informed consent has been obtained and will be scanned into the patient's electronic chart. We will re-consent if the type of botulinum toxin or the dosing or the distribution changes in the future. The patient is informed that we will use the same consent for Her recurrent, most likely 3 monthly injections. I talked to the patient in detail about expectations, limitations, benefits as well as potential adverse effects of botulinum toxin injections. The patient understands that the side effects are not limited to the ones I discussed, which include mouth dryness, dryness of eyes, speech and swallowing difficulties, respiratory depression or problems breathing, weakness of muscles including more distant muscles than the ones injected, flu-like symptoms, myalgias, injection site reactions such as redness, itching, swelling, pain, and infection.  200 units of botulinum toxin type A in the form of Xeomin were reconstituted using preservative-free normal saline to a concentration of 10 units per 0.1 mL and drawn up into 1 mL tuberculin syringes. Lot number was:  Lot: [id number] Expiration:  07/2023 QPR:9163-8466-59  Bacteriostatic 0.9% Sodium Chloride- 2.26m total Lot: [id number] Expiration: 03/31/2023 NDC: [address]-93 The patient was situated in a chair, sitting comfortably. After preparing the areas with 70% isopropyl alcohol and using a 26 gauge 1/2 inch hollow lumen recording EMG needle, a total dose of 125 units of botulinum toxin type A in the form of Xeomin was injected into the muscles and the following distribution and quantities:  #1:  20 units in the left levator scapulae muscle #2:  10 in the right levator scapulae muscle #3:  15 units in the right longissimus muscle #4:  15 units in the right and 15 units in the left splenius capitis muscles #5:  10 units in the right and 10 units in the left splenius cervices muscles #6:  15 units in the right and 15 units in the left SCM muscles  EMG guidance was utilized for these injections with mild to moderate EMG activity noted.   A dose of 75 units out of a total dose of 200 units was discarded as unavoidable waste.   The patient tolerated the procedure well without immediate complications. Lori Michael was advised to make a followup appointment for repeat injections in 3 months from now and encouraged to call [facility] any interim questions, concerns, problems, or updates. Lori Michael was in agreement and did not have any questions prior to leaving clinic today.  Previously (copied from previous notes for reference):   09/08/21: (Lori Michael) has a longstanding history of a cervical dystonic head tremor.  I had evaluated her 3 years ago and a year prior to that and we had talked about Botox injections but Lori Michael decided to hold off.  I reviewed your  office note from 06/23/22.  Lori Michael had blood work through your office in January 2022, at which time her blood sugar was 173, BUN 25, creatinine 1.3.  Lori Michael also had blood work through your office on 12/22/2020 at which time her blood sugar was 168, BUN 32, creatinine 1.31, sodium 136, potassium 4.1, calcium 9.6,  AST 19, ALT 18, alk phos 68.  Lori Michael had a brain MRI without contrast on 09/10/2018 and I reviewed the results:   IMPRESSION: This MRI of the brain without contrast shows the following: 1.    Scattered T2/flair hyperintense foci in the hemispheres consistent with mild chronic microvascular ischemic change.  None of these appear to be acute. 2.    There are no acute findings.   Lori Michael reports that Lori Michael has been on potassium supplementation since Lori Michael was found to have low potassium about a month ago.  Her level was 2.6 and Lori Michael is currently on 20 meq twice daily.  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  Lori Michael was evaluated in late November and has a follow-up pending.  Lori Michael feels that Lori Michael has had some improvement in her fatigue and lack of energy since Lori Michael has been on potassium.  Her head tremor also flared up around that time but is now back to baseline.  Lori Michael continues to have a side-to-side head tremor and involuntary neck muscle spasms.  Lori Michael is reluctant to proceed with Botox injections at this time given that Lori Michael has work-up pending and a follow-up with hematology.  Lori Michael would like to have some more information and closure about her hematological condition.  We talked about Botox injections at length today.  We talked about expectations, potential side effects and the muscles I would target with injections.  Lori Michael was given some written information as well but would still like to hold off.  I offered her that we could go ahead with the consent and the insurance authorization but Lori Michael would like to call us back for this. Lori Michael has not fallen recently.  Lori Michael has not had any new neurological symptoms.     09/04/2018: 81 year old right-handed woman with an underlying medical history of type 2 diabetes, hypertriglyceridemia, hypertension and allergic rhinitis, who presents for follow-up consultation of her dystonic head tremor and consideration for botulinum toxin injections. The patient is unaccompanied today. I first met  her on 10/18/2017 at the request of her primary care physician, at which time Lori Michael reported a long-standing history of head tremor. On examination, Lori Michael had evidence of cervical dystonia with dystonic tremor. Lori Michael was advised to proceed with a brain MRI, Lori Michael declined it at the time. Lori Michael was also advised to consider Botox injections and Lori Michael was given information about it, Lori Michael wanted to hold off.   Today, 09/04/2018: Lori Michael reports no change in her tremor. Lori Michael has had more stress, husband's brother has been sick, he is 27. Other than that, Lori Michael follows regularly with her primary care physician and diabetes specialist. Lori Michael has been told to watch her kidney function. Lori Michael has also had some changes in her diabetes medicine.    10/18/2017: (Lori Michael) reports a long-standing history of head tremor. It seems to get worse when Lori Michael is anxious. Lori Michael is often unaware of it but sometimes it flares up. Lori Michael has noticed that when Lori Michael holds her right hand against her chin in support of her head, the tremor seems to calm down briefly, Lori Michael also tends to lean backwards against a surface which helps control the tremor. Years  ago Lori Michael may have tried some medication with her previous primary care physician but does not remember the name of the medication. I reviewed your office note from 08/31/2017, which you kindly included. Lori Michael had recent blood work through your office in December 2018 which I reviewed, CMP was unremarkable with the exception of glucose at 186, vitamin D level was borderline low at 28, lipid panel from September 2018 showed a total cholesterol of 159, triglycerides of 267, LDL 69, BMP in your office on 08/31/2017 was indicative of increased creatinine of 1.6 and glucose level was 194. Lori Michael has been on vitamin D supplementation, of note, Lori Michael is on metoprolol 50 mg long-acting once daily. Her latest A1c was around 7 point something Lori Michael recalls. 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 Lori Michael had left carpal tunnel surgery. Lori Michael reports no significant tremor history in her family but recalls that may be her father had a head tremor. Lori Michael had a total of 7 brothers and 2 sisters, one brother passed away and one sister passed away, none with tremors. Lori Michael does not utilize alcohol, Lori Michael is a nonsmoker, Lori Michael drinks coffee very infrequently, diet soda 1 per day on average.  Review of Systems  Neurological:        Pt is here for [facility] injection for Cervical Dystonia   XEOMIN - 100 units x 2 vials Lot: [id number] Expiration: 07/2023 NDC:0259-1610-01  Bacteriostatic 0.9% Sodium Chloride- 2.90m total Lot: [id number] Expiration: 03/31/2023 NDC: [address]-32 Dx: G24.3 B/B

## 2021-11-16 ENCOUNTER — Other Ambulatory Visit: Payer: Self-pay | Admitting: Hematology and Oncology

## 2022-01-05 DIAGNOSIS — E782 Mixed hyperlipidemia: Secondary | ICD-10-CM | POA: Diagnosis not present

## 2022-01-05 DIAGNOSIS — I10 Essential (primary) hypertension: Secondary | ICD-10-CM | POA: Diagnosis not present

## 2022-01-05 DIAGNOSIS — E118 Type 2 diabetes mellitus with unspecified complications: Secondary | ICD-10-CM | POA: Diagnosis not present

## 2022-01-12 DIAGNOSIS — N1832 Chronic kidney disease, stage 3b: Secondary | ICD-10-CM | POA: Diagnosis not present

## 2022-01-12 DIAGNOSIS — E118 Type 2 diabetes mellitus with unspecified complications: Secondary | ICD-10-CM | POA: Diagnosis not present

## 2022-01-12 DIAGNOSIS — M810 Age-related osteoporosis without current pathological fracture: Secondary | ICD-10-CM | POA: Diagnosis not present

## 2022-01-12 DIAGNOSIS — I10 Essential (primary) hypertension: Secondary | ICD-10-CM | POA: Diagnosis not present

## 2022-01-12 DIAGNOSIS — E782 Mixed hyperlipidemia: Secondary | ICD-10-CM | POA: Diagnosis not present

## 2022-01-12 DIAGNOSIS — E876 Hypokalemia: Secondary | ICD-10-CM | POA: Diagnosis not present

## 2022-01-22 ENCOUNTER — Inpatient Hospital Stay: Payer: Medicare Other | Attending: Hematology and Oncology

## 2022-01-22 ENCOUNTER — Inpatient Hospital Stay (HOSPITAL_BASED_OUTPATIENT_CLINIC_OR_DEPARTMENT_OTHER): Payer: Medicare Other | Admitting: Hematology and Oncology

## 2022-01-22 ENCOUNTER — Other Ambulatory Visit: Payer: Self-pay

## 2022-01-22 ENCOUNTER — Other Ambulatory Visit: Payer: Self-pay | Admitting: Hematology and Oncology

## 2022-01-22 VITALS — BP 146/77 | HR 70 | Temp 97.9°F | Resp 18 | Ht 63.0 in | Wt 127.0 lb

## 2022-01-22 DIAGNOSIS — Z79899 Other long term (current) drug therapy: Secondary | ICD-10-CM | POA: Diagnosis not present

## 2022-01-22 DIAGNOSIS — D472 Monoclonal gammopathy: Secondary | ICD-10-CM

## 2022-01-22 DIAGNOSIS — Z7984 Long term (current) use of oral hypoglycemic drugs: Secondary | ICD-10-CM | POA: Diagnosis not present

## 2022-01-22 DIAGNOSIS — M25512 Pain in left shoulder: Secondary | ICD-10-CM | POA: Diagnosis not present

## 2022-01-22 DIAGNOSIS — Z7985 Long-term (current) use of injectable non-insulin antidiabetic drugs: Secondary | ICD-10-CM | POA: Insufficient documentation

## 2022-01-22 LAB — CBC WITH DIFFERENTIAL (CANCER CENTER ONLY)
Abs Immature Granulocytes: 0.01 10*3/uL (ref 0.00–0.07)
Basophils Absolute: 0.1 10*3/uL (ref 0.0–0.1)
Basophils Relative: 1 %
Eosinophils Absolute: 0.2 10*3/uL (ref 0.0–0.5)
Eosinophils Relative: 3 %
HCT: 36.8 % (ref 36.0–46.0)
Hemoglobin: 13.1 g/dL (ref 12.0–15.0)
Immature Granulocytes: 0 %
Lymphocytes Relative: 24 %
Lymphs Abs: 1.6 10*3/uL (ref 0.7–4.0)
MCH: 31.3 pg (ref 26.0–34.0)
MCHC: 35.6 g/dL (ref 30.0–36.0)
MCV: 87.8 fL (ref 80.0–100.0)
Monocytes Absolute: 0.8 10*3/uL (ref 0.1–1.0)
Monocytes Relative: 11 %
Neutro Abs: 4.2 10*3/uL (ref 1.7–7.7)
Neutrophils Relative %: 61 %
Platelet Count: 294 10*3/uL (ref 150–400)
RBC: 4.19 MIL/uL (ref 3.87–5.11)
RDW: 12.3 % (ref 11.5–15.5)
WBC Count: 6.9 10*3/uL (ref 4.0–10.5)
nRBC: 0 % (ref 0.0–0.2)

## 2022-01-22 LAB — CMP (CANCER CENTER ONLY)
ALT: 18 U/L (ref 0–44)
AST: 22 U/L (ref 15–41)
Albumin: 4.7 g/dL (ref 3.5–5.0)
Alkaline Phosphatase: 58 U/L (ref 38–126)
Anion gap: 12 (ref 5–15)
BUN: 18 mg/dL (ref 8–23)
CO2: 29 mmol/L (ref 22–32)
Calcium: 10 mg/dL (ref 8.9–10.3)
Chloride: 95 mmol/L — ABNORMAL LOW (ref 98–111)
Creatinine: 1.26 mg/dL — ABNORMAL HIGH (ref 0.44–1.00)
GFR, Estimated: 43 mL/min — ABNORMAL LOW (ref >60)
Glucose, Bld: 147 mg/dL — ABNORMAL HIGH (ref 70–99)
Potassium: 3.1 mmol/L — ABNORMAL LOW (ref 3.5–5.1)
Sodium: 136 mmol/L (ref 135–145)
Total Bilirubin: 0.8 mg/dL (ref 0.3–1.2)
Total Protein: 8.5 g/dL — ABNORMAL HIGH (ref 6.5–8.1)

## 2022-01-22 LAB — LACTATE DEHYDROGENASE: LDH: 147 U/L (ref 98–192)

## 2022-01-22 NOTE — Progress Notes (Signed)
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  07/27/2021: establish care with Dr. Lorenso Michael.  SPEP shows polyclonal increase in immunoglobulins, though skeletal survey showed concern for lytic lesion in the left humerus.  Interval History:  Lori Michael 81 y.o. female with medical history significant for possible MGUS who presents for a follow up visit. The patient's last visit was on 07/27/2021. In the interim since the last visit she has had no major changes in her health.   On exam today Lori Michael reports she is feeling considerably better after having started potassium pills.  She notes that she is been on them for about 5 weeks and feels great.  She notes that she has been taking about 1 pill/day.  She notes that she does have some occasional pain in her left shoulder and does have some carpal tunnel on that side.  She notes her energy levels have been good though she does suffer from a resting tremor for which she recently received Botox shots in the muscles of her neck with some modest relief.  She notes as noted present treatment coming up on 02/01/2022.  Overall she feels better than her last visit.  She denies any fevers, chills, sweats, nausea, ming or diarrhea.  Full 10 point ROS is listed below.  MEDICAL HISTORY:  Past Medical History:  Diagnosis Date   Carpal tunnel syndrome on left    Diabetes mellitus ORAL MEDS   Fibromyalgia    Frequency of urination    Hypertension    Hypertriglyceridemia    Insomnia    PONV (postoperative nausea and vomiting)    Type 2 diabetes mellitus ([place])    with retinopathy    SURGICAL HISTORY: Past Surgical History:  Procedure Laterality Date   bilateral eye  implants  11/2013   CARPAL TUNNEL RELEASE  09/14/2011   Procedure: CARPAL TUNNEL RELEASE;  Surgeon: Lori Michael;  Location: [facility];  Service: Orthopedics;  Laterality: Left;   DUPUYTREN CONTRACTURE RELEASE  09/14/2011   Procedure: DUPUYTREN CONTRACTURE RELEASE;  Surgeon: Lori Michael;  Location: [facility];  Service: Orthopedics;  Laterality: Left;   right eye calcium removal  01/2018   TONSILLECTOMY  Michael 44   TOTAL ABDOMINAL HYSTERECTOMY W/ BILATERAL SALPINGOOPHORECTOMY  Michael 59    SOCIAL HISTORY: Social History   Socioeconomic History   Marital status: Married    Spouse name: Not on file   Number of children: Not on file   Years of education: Not on file   Highest education level: Some college, no degree  Occupational History   Not on file  Tobacco Use   Smoking status: Never   Smokeless tobacco: Never  Substance and Sexual Activity   Alcohol use: Never   Drug use: Never   Sexual activity: Not on file  Other Topics Concern   Not on file  Social History Narrative   Lives at home with husband   Retired   Caffeine: "some"   Social Determinants of Radio broadcast assistant Strain: Not on file  Food Insecurity: Not on file  Transportation Needs: Not on file  Physical Activity: Not on file  Stress: Not on file  Social Connections: Not on file  Intimate Partner Violence:  Not on file    FAMILY HISTORY: Family History  Problem Relation Michael of Onset   Diabetes Mother    Heart attack Mother    Stroke Father    Breast cancer Sister 55   Breast cancer Sister        diagnosed in her 37's   Healthy Daughter    Healthy Son     ALLERGIES:  is allergic to fentanyl and latex.  MEDICATIONS:  Current Outpatient Medications  Medication Sig Dispense Refill   amLODipine (NORVASC) 5 MG tablet Take 1 tablet by mouth daily.     Blood Glucose Monitoring Suppl (ONETOUCH VERIO FLEX SYSTEM) w/Device KIT AS DIRECTED 365 DAYS      glipiZIDE (GLUCOTROL) 5 MG tablet Take 2.5 mg by mouth 2 (two) times daily as needed.     metoprolol succinate (TOPROL-XL) 50 MG 24 hr tablet Take 50 mg by mouth every morning. Take with or immediately following a meal.     olmesartan-hydrochlorothiazide (BENICAR HCT) 40-12.5 MG tablet Take 1 tablet by mouth daily.     potassium chloride SA (KLOR-CON M) 20 MEQ tablet TAKE 1 TABLET BY MOUTH TWICE DAILY 60 tablet 0   rosuvastatin (CRESTOR) 5 MG tablet Take 5 mg by mouth daily.     TRULICITY 3 ZH/0.8MV SOPN Inject into the skin.     Vitamin D, Cholecalciferol, 25 MCG (1000 UT) CAPS Take by mouth.     Current Facility-Administered Medications  Medication Dose Route Frequency Provider Last Rate Last Admin   incobotulinumtoxinA (XEOMIN) 100 units injection 200 Units  200 Units Intramuscular Q90 days Lori Age, MD   200 Units at 10/28/21 1453    REVIEW OF SYSTEMS:   Constitutional: ( - ) fevers, ( - )  chills , ( - ) night sweats Eyes: ( - ) blurriness of vision, ( - ) double vision, ( - ) watery eyes Ears, nose, mouth, throat, and face: ( - ) mucositis, ( - ) sore throat Respiratory: ( - ) cough, ( - ) dyspnea, ( - ) wheezes Cardiovascular: ( - ) palpitation, ( - ) chest discomfort, ( - ) lower extremity swelling Gastrointestinal:  ( - ) nausea, ( - ) heartburn, ( - ) change in bowel habits Skin: ( - ) abnormal skin rashes Lymphatics: ( - ) new lymphadenopathy, ( - ) easy bruising Neurological: ( - ) numbness, ( - ) tingling, ( - ) new weaknesses Behavioral/Psych: ( - ) mood change, ( - ) new changes  All other systems were reviewed with the patient and are negative.  PHYSICAL EXAMINATION:  Vitals:   01/22/22 1056  BP: (!) 146/77  Pulse: 70  Resp: 18  Temp: 97.9 F (36.6 C)  SpO2: 96%   Filed Weights   01/22/22 1056  Weight: 127 lb (57.6 kg)    GENERAL: Well-appearing elderly Caucasian female, alert, no distress and comfortable SKIN: skin color, texture, turgor are normal, no  rashes or significant lesions EYES: conjunctiva are pink and non-injected, sclera clear LUNGS: clear to auscultation and percussion with normal breathing effort HEART: regular rate & rhythm and no murmurs and no lower extremity edema Musculoskeletal: no cyanosis of digits and no clubbing  PSYCH: alert & oriented x 3, fluent speech NEURO: no focal motor/sensory deficits  LABORATORY DATA:  I have reviewed the data as listed    Latest Ref Rng & Units 01/22/2022   10:12 AM 08/12/2021    3:27 PM 07/27/2021    2:56 PM  CBC  WBC 4.0 - 10.5 K/uL 6.9   7.4   8.4    Hemoglobin 12.0 - 15.0 g/dL 13.1   13.0   12.4    Hematocrit 36.0 - 46.0 % 36.8   38.0   35.5    Platelets 150 - 400 K/uL 294   304   294         Latest Ref Rng & Units 01/22/2022   10:12 AM 08/12/2021    3:27 PM 07/27/2021    2:56 PM  CMP  Glucose 70 - 99 mg/dL 147   159   130    BUN 8 - 23 mg/dL '18   21   31    ' Creatinine 0.44 - 1.00 mg/dL 1.26   1.39   1.62    Sodium 135 - 145 mmol/L 136   140   140    Potassium 3.5 - 5.1 mmol/L 3.1   3.8   2.6    Chloride 98 - 111 mmol/L 95   99   98    CO2 22 - 32 mmol/L '29   29   30    ' Calcium 8.9 - 10.3 mg/dL 10.0   10.0   9.8    Total Protein 6.5 - 8.1 g/dL 8.5   8.6   8.4    Total Bilirubin 0.3 - 1.2 mg/dL 0.8   0.7   0.5    Alkaline Phos 38 - 126 U/L 58   67   66    AST 15 - 41 U/L '22   26   25    ' ALT 0 - 44 U/L '18   21   20      ' Lab Results  Component Value Date   MPROTEIN Not Observed 07/27/2021   Lab Results  Component Value Date   KPAFRELGTCHN 55.1 (H) 01/22/2022   KPAFRELGTCHN 53.6 (H) 07/27/2021   LAMBDASER 28.8 (H) 01/22/2022   LAMBDASER 30.2 (H) 07/27/2021   KAPLAMBRATIO 1.91 (H) 01/22/2022   KAPLAMBRATIO 9.32 08/05/2021   KAPLAMBRATIO 1.77 (H) 07/27/2021    RADIOGRAPHIC STUDIES:  ASSESSMENT & PLAN Lori Michael 81 y.o. female with medical history significant for possible MGUS who presents for a follow up visit.    After review of the labs, review of  the records, and discussion with the patient the patients findings are most consistent with a possible monoclonal gammopathy.  On prior SPEP she was noted to have a faint free lambda light chain concerning for a monoclonal gammopathy.     # Lytic Lesion on Left Humerus --today will re-order an SPEP, UPEP, SFLC and beta 2 microglobulin.  Prior labs showed a polyclonal increase in immunoglobulins with no evidence of M protein. --additionally will collect new baseline CBC, CMP, and LDH --metastatic bone survey showed a lytic lesion on the humeral head, noted to be indeterminate.  MRI ordered to further evaluate --will consider the need for a bone marrow biopsy pending the above results. --RTC pending the results of the above studies.   Orders Placed This Encounter  Procedures   MR Humerus Left W Contrast    Standing Status:   Future    Standing Expiration Date:   01/26/2023    Order Specific Question:   Reason for Exam (SYMPTOM  OR DIAGNOSIS REQUIRED)    Answer:   lytic lesion on head of humerus, requesting further evaluation    Order Specific Question:   If indicated for the ordered procedure, I authorize the administration of contrast media  per Radiology protocol    Answer:   Yes    Order Specific Question:   What is the patient's sedation requirement?    Answer:   No Sedation    Order Specific Question:   Does the patient have a pacemaker or implanted devices?    Answer:   No    Order Specific Question:   Preferred imaging location?    Answer:   [facility] (table limit - 550 lbs)    All questions were answered. The patient knows to call the clinic with any problems, questions or concerns.  A total of more than 30 minutes were spent on this encounter with face-to-face time and non-face-to-face time, including preparing to see the patient, ordering tests and/or medications, counseling the patient and coordination of care as outlined above.   Ledell Peoples, MD Department of  Hematology/Oncology [place] [facility] Phone: [phone] Pager: [phone] Email: [email or web address]  01/26/2022 3:51 PM

## 2022-01-23 LAB — BETA 2 MICROGLOBULIN, SERUM: Beta-2 Microglobulin: 3.1 mg/L — ABNORMAL HIGH (ref 0.6–2.4)

## 2022-01-26 LAB — KAPPA/LAMBDA LIGHT CHAINS
Kappa free light chain: 55.1 mg/L — ABNORMAL HIGH (ref 3.3–19.4)
Kappa, lambda light chain ratio: 1.91 — ABNORMAL HIGH (ref 0.26–1.65)
Lambda free light chains: 28.8 mg/L — ABNORMAL HIGH (ref 5.7–26.3)

## 2022-01-26 LAB — MULTIPLE MYELOMA PANEL, SERUM
Albumin SerPl Elph-Mcnc: 4.2 g/dL (ref 2.9–4.4)
Albumin/Glob SerPl: 1.1 (ref 0.7–1.7)
Alpha 1: 0.2 g/dL (ref 0.0–0.4)
Alpha2 Glob SerPl Elph-Mcnc: 0.9 g/dL (ref 0.4–1.0)
B-Globulin SerPl Elph-Mcnc: 1.1 g/dL (ref 0.7–1.3)
Gamma Glob SerPl Elph-Mcnc: 1.7 g/dL (ref 0.4–1.8)
Globulin, Total: 3.9 g/dL (ref 2.2–3.9)
IgA: 191 mg/dL (ref 64–422)
IgG (Immunoglobin G), Serum: 1598 mg/dL (ref 586–1602)
IgM (Immunoglobulin M), Srm: 186 mg/dL (ref 26–217)
Total Protein ELP: 8.1 g/dL (ref 6.0–8.5)

## 2022-01-27 ENCOUNTER — Other Ambulatory Visit: Payer: Self-pay

## 2022-01-27 DIAGNOSIS — M25512 Pain in left shoulder: Secondary | ICD-10-CM | POA: Diagnosis not present

## 2022-01-27 DIAGNOSIS — Z79899 Other long term (current) drug therapy: Secondary | ICD-10-CM | POA: Diagnosis not present

## 2022-01-27 DIAGNOSIS — D472 Monoclonal gammopathy: Secondary | ICD-10-CM | POA: Diagnosis not present

## 2022-01-27 DIAGNOSIS — Z7984 Long term (current) use of oral hypoglycemic drugs: Secondary | ICD-10-CM | POA: Diagnosis not present

## 2022-01-27 DIAGNOSIS — Z7985 Long-term (current) use of injectable non-insulin antidiabetic drugs: Secondary | ICD-10-CM | POA: Diagnosis not present

## 2022-01-28 ENCOUNTER — Telehealth: Payer: Self-pay | Admitting: Neurology

## 2022-01-28 NOTE — Telephone Encounter (Signed)
When patient calls back please reschedule her Xeomin appointment.

## 2022-01-29 LAB — UPEP/UIFE/LIGHT CHAINS/TP, 24-HR UR
% BETA, Urine: 22.5 %
ALPHA 1 URINE: 1 %
Albumin, U: 27.8 %
Alpha 2, Urine: 8.6 %
Free Kappa Lt Chains,Ur: 65.38 mg/L (ref 1.17–86.46)
Free Kappa/Lambda Ratio: 10.6 (ref 1.83–14.26)
Free Lambda Lt Chains,Ur: 6.17 mg/L (ref 0.27–15.21)
GAMMA GLOBULIN URINE: 40.1 %
Total Protein, Urine-Ur/day: 119 mg/24 hr (ref 30–150)
Total Protein, Urine: 5.4 mg/dL
Total Volume: 2200

## 2022-02-01 ENCOUNTER — Ambulatory Visit: Payer: Medicare Other | Admitting: Neurology

## 2022-02-05 ENCOUNTER — Ambulatory Visit (HOSPITAL_COMMUNITY)
Admission: RE | Admit: 2022-02-05 | Discharge: 2022-02-05 | Disposition: A | Discharge status: Home / Self Care | Payer: Medicare Other | Source: Ambulatory Visit | Attending: Hematology and Oncology | Admitting: Hematology and Oncology

## 2022-02-05 DIAGNOSIS — D472 Monoclonal gammopathy: Secondary | ICD-10-CM | POA: Diagnosis not present

## 2022-02-05 DIAGNOSIS — M19012 Primary osteoarthritis, left shoulder: Secondary | ICD-10-CM | POA: Diagnosis not present

## 2022-02-05 DIAGNOSIS — M75102 Unspecified rotator cuff tear or rupture of left shoulder, not specified as traumatic: Secondary | ICD-10-CM | POA: Diagnosis not present

## 2022-02-05 MED ORDER — GADOBUTROL 1 MMOL/ML IV SOLN
6.0000 mL | Freq: Once | INTRAVENOUS | Status: AC | PRN
Start: 2022-02-05 — End: 2022-02-05
  Administered 2022-02-05: 6 mL via INTRAVENOUS

## 2022-02-08 ENCOUNTER — Telehealth: Payer: Self-pay | Admitting: *Deleted

## 2022-02-08 NOTE — Telephone Encounter (Signed)
Received call from pt regarding her recent MRI of her shoulder. Per Dr. Lorenso Courier:  "MRI shoulder showed no clear evidence of a lytic lesion or any problems with the shoulder.  Additionally her blood work did not show any evidence of an M protein.  Given these findings there is no need for routine follow-up in our clinic.  We are happy to see her back if she would develop any new hematological issues.  "  Advised pt of the above. Pt voiced understanding.

## 2022-03-23 DIAGNOSIS — E113319 Type 2 diabetes mellitus with moderate nonproliferative diabetic retinopathy with macular edema, unspecified eye: Secondary | ICD-10-CM | POA: Diagnosis not present

## 2022-04-14 DIAGNOSIS — H524 Presbyopia: Secondary | ICD-10-CM | POA: Diagnosis not present

## 2022-04-14 DIAGNOSIS — Z961 Presence of intraocular lens: Secondary | ICD-10-CM | POA: Diagnosis not present

## 2022-04-14 DIAGNOSIS — H02839 Dermatochalasis of unspecified eye, unspecified eyelid: Secondary | ICD-10-CM | POA: Diagnosis not present

## 2022-04-14 DIAGNOSIS — H52222 Regular astigmatism, left eye: Secondary | ICD-10-CM | POA: Diagnosis not present

## 2022-04-14 DIAGNOSIS — E113293 Type 2 diabetes mellitus with mild nonproliferative diabetic retinopathy without macular edema, bilateral: Secondary | ICD-10-CM | POA: Diagnosis not present

## 2022-05-19 DIAGNOSIS — I6782 Cerebral ischemia: Secondary | ICD-10-CM | POA: Diagnosis not present

## 2022-05-19 DIAGNOSIS — S0033XA Contusion of nose, initial encounter: Secondary | ICD-10-CM | POA: Diagnosis not present

## 2022-05-19 DIAGNOSIS — R739 Hyperglycemia, unspecified: Secondary | ICD-10-CM | POA: Diagnosis not present

## 2022-05-19 DIAGNOSIS — G4489 Other headache syndrome: Secondary | ICD-10-CM | POA: Diagnosis not present

## 2022-05-19 DIAGNOSIS — M25531 Pain in right wrist: Secondary | ICD-10-CM | POA: Diagnosis not present

## 2022-05-19 DIAGNOSIS — S0083XA Contusion of other part of head, initial encounter: Secondary | ICD-10-CM | POA: Diagnosis not present

## 2022-05-19 DIAGNOSIS — Z043 Encounter for examination and observation following other accident: Secondary | ICD-10-CM | POA: Diagnosis not present

## 2022-05-19 DIAGNOSIS — S80919A Unspecified superficial injury of unspecified knee, initial encounter: Secondary | ICD-10-CM | POA: Diagnosis not present

## 2022-05-19 DIAGNOSIS — M25539 Pain in unspecified wrist: Secondary | ICD-10-CM | POA: Diagnosis not present

## 2022-05-19 DIAGNOSIS — M25532 Pain in left wrist: Secondary | ICD-10-CM | POA: Diagnosis not present

## 2022-05-27 DIAGNOSIS — W19XXXD Unspecified fall, subsequent encounter: Secondary | ICD-10-CM | POA: Diagnosis not present

## 2022-05-27 DIAGNOSIS — Z09 Encounter for follow-up examination after completed treatment for conditions other than malignant neoplasm: Secondary | ICD-10-CM | POA: Diagnosis not present

## 2022-05-28 ENCOUNTER — Telehealth: Payer: Self-pay

## 2022-05-28 NOTE — Telephone Encounter (Signed)
     Patient  visit on 9/20  at [facility]  Have you been able to follow up with your primary care physician?yes  The patient was or was not able to obtain any needed medicine or equipment.yes  Are there diet recommendations that you are having difficulty following? na Patient expresses understanding of discharge instructions and education provided has no other needs at this time. yes     [place], Care Management  [phone] [address], [place],  [address] Phone: [phone] Email: [email or web address]

## 2022-06-24 DIAGNOSIS — E118 Type 2 diabetes mellitus with unspecified complications: Secondary | ICD-10-CM | POA: Diagnosis not present

## 2022-06-28 ENCOUNTER — Other Ambulatory Visit (HOSPITAL_COMMUNITY): Payer: Self-pay | Admitting: Internal Medicine

## 2022-06-28 ENCOUNTER — Other Ambulatory Visit: Payer: Self-pay | Admitting: Internal Medicine

## 2022-06-28 DIAGNOSIS — E782 Mixed hyperlipidemia: Secondary | ICD-10-CM | POA: Diagnosis not present

## 2022-06-28 DIAGNOSIS — N1832 Chronic kidney disease, stage 3b: Secondary | ICD-10-CM | POA: Diagnosis not present

## 2022-06-28 DIAGNOSIS — L304 Erythema intertrigo: Secondary | ICD-10-CM | POA: Diagnosis not present

## 2022-06-28 DIAGNOSIS — I10 Essential (primary) hypertension: Secondary | ICD-10-CM

## 2022-06-28 DIAGNOSIS — Z23 Encounter for immunization: Secondary | ICD-10-CM | POA: Diagnosis not present

## 2022-06-28 DIAGNOSIS — G243 Spasmodic torticollis: Secondary | ICD-10-CM | POA: Diagnosis not present

## 2022-06-28 DIAGNOSIS — E1140 Type 2 diabetes mellitus with diabetic neuropathy, unspecified: Secondary | ICD-10-CM | POA: Diagnosis not present

## 2022-06-28 DIAGNOSIS — Z0000 Encounter for general adult medical examination without abnormal findings: Secondary | ICD-10-CM | POA: Diagnosis not present

## 2022-06-28 DIAGNOSIS — M810 Age-related osteoporosis without current pathological fracture: Secondary | ICD-10-CM | POA: Diagnosis not present

## 2022-06-28 DIAGNOSIS — E871 Hypo-osmolality and hyponatremia: Secondary | ICD-10-CM | POA: Diagnosis not present

## 2022-07-27 ENCOUNTER — Ambulatory Visit (HOSPITAL_COMMUNITY)
Admission: RE | Admit: 2022-07-27 | Discharge: 2022-07-27 | Disposition: A | Discharge status: Home / Self Care | Payer: Medicare Other | Source: Ambulatory Visit | Attending: Internal Medicine | Admitting: Internal Medicine

## 2022-07-27 DIAGNOSIS — I10 Essential (primary) hypertension: Secondary | ICD-10-CM | POA: Insufficient documentation

## 2022-07-29 DIAGNOSIS — I10 Essential (primary) hypertension: Secondary | ICD-10-CM | POA: Diagnosis not present

## 2022-07-29 DIAGNOSIS — E1140 Type 2 diabetes mellitus with diabetic neuropathy, unspecified: Secondary | ICD-10-CM | POA: Diagnosis not present

## 2022-07-29 DIAGNOSIS — E1165 Type 2 diabetes mellitus with hyperglycemia: Secondary | ICD-10-CM | POA: Diagnosis not present

## 2022-07-29 DIAGNOSIS — E782 Mixed hyperlipidemia: Secondary | ICD-10-CM | POA: Diagnosis not present

## 2022-07-29 DIAGNOSIS — E113319 Type 2 diabetes mellitus with moderate nonproliferative diabetic retinopathy with macular edema, unspecified eye: Secondary | ICD-10-CM | POA: Diagnosis not present

## 2022-08-18 DIAGNOSIS — M720 Palmar fascial fibromatosis [Dupuytren]: Secondary | ICD-10-CM | POA: Diagnosis not present

## 2022-08-18 DIAGNOSIS — I2510 Atherosclerotic heart disease of native coronary artery without angina pectoris: Secondary | ICD-10-CM | POA: Diagnosis not present

## 2022-08-18 DIAGNOSIS — E876 Hypokalemia: Secondary | ICD-10-CM | POA: Diagnosis not present

## 2022-09-01 DIAGNOSIS — E876 Hypokalemia: Secondary | ICD-10-CM | POA: Diagnosis not present

## 2022-10-26 ENCOUNTER — Other Ambulatory Visit: Payer: Self-pay | Admitting: Hematology and Oncology

## 2022-10-28 DIAGNOSIS — I10 Essential (primary) hypertension: Secondary | ICD-10-CM | POA: Diagnosis not present

## 2022-10-28 DIAGNOSIS — E11319 Type 2 diabetes mellitus with unspecified diabetic retinopathy without macular edema: Secondary | ICD-10-CM | POA: Diagnosis not present

## 2022-10-28 DIAGNOSIS — E782 Mixed hyperlipidemia: Secondary | ICD-10-CM | POA: Diagnosis not present

## 2022-10-28 DIAGNOSIS — E1165 Type 2 diabetes mellitus with hyperglycemia: Secondary | ICD-10-CM | POA: Diagnosis not present

## 2022-10-28 DIAGNOSIS — N1832 Chronic kidney disease, stage 3b: Secondary | ICD-10-CM | POA: Diagnosis not present

## 2023-02-15 DIAGNOSIS — M720 Palmar fascial fibromatosis [Dupuytren]: Secondary | ICD-10-CM | POA: Diagnosis not present

## 2023-02-28 DIAGNOSIS — E1165 Type 2 diabetes mellitus with hyperglycemia: Secondary | ICD-10-CM | POA: Diagnosis not present

## 2023-02-28 DIAGNOSIS — E782 Mixed hyperlipidemia: Secondary | ICD-10-CM | POA: Diagnosis not present

## 2023-03-07 DIAGNOSIS — N1831 Chronic kidney disease, stage 3a: Secondary | ICD-10-CM | POA: Diagnosis not present

## 2023-03-07 DIAGNOSIS — I10 Essential (primary) hypertension: Secondary | ICD-10-CM | POA: Diagnosis not present

## 2023-03-07 DIAGNOSIS — E1165 Type 2 diabetes mellitus with hyperglycemia: Secondary | ICD-10-CM | POA: Diagnosis not present

## 2023-03-07 DIAGNOSIS — E782 Mixed hyperlipidemia: Secondary | ICD-10-CM | POA: Diagnosis not present

## 2023-03-10 DIAGNOSIS — M720 Palmar fascial fibromatosis [Dupuytren]: Secondary | ICD-10-CM | POA: Insufficient documentation

## 2023-03-10 DIAGNOSIS — G5622 Lesion of ulnar nerve, left upper limb: Secondary | ICD-10-CM | POA: Diagnosis not present

## 2023-03-10 DIAGNOSIS — G5612 Other lesions of median nerve, left upper limb: Secondary | ICD-10-CM | POA: Diagnosis not present

## 2023-03-10 DIAGNOSIS — G5602 Carpal tunnel syndrome, left upper limb: Secondary | ICD-10-CM | POA: Diagnosis not present

## 2023-03-22 DIAGNOSIS — G5602 Carpal tunnel syndrome, left upper limb: Secondary | ICD-10-CM | POA: Diagnosis not present

## 2023-03-22 DIAGNOSIS — G5622 Lesion of ulnar nerve, left upper limb: Secondary | ICD-10-CM | POA: Diagnosis not present

## 2023-04-04 DIAGNOSIS — M720 Palmar fascial fibromatosis [Dupuytren]: Secondary | ICD-10-CM | POA: Diagnosis not present

## 2023-04-04 DIAGNOSIS — G5622 Lesion of ulnar nerve, left upper limb: Secondary | ICD-10-CM | POA: Diagnosis not present

## 2023-04-04 DIAGNOSIS — G5602 Carpal tunnel syndrome, left upper limb: Secondary | ICD-10-CM | POA: Diagnosis not present

## 2023-04-18 DIAGNOSIS — Z961 Presence of intraocular lens: Secondary | ICD-10-CM | POA: Diagnosis not present

## 2023-04-18 DIAGNOSIS — H524 Presbyopia: Secondary | ICD-10-CM | POA: Diagnosis not present

## 2023-04-18 DIAGNOSIS — H02839 Dermatochalasis of unspecified eye, unspecified eyelid: Secondary | ICD-10-CM | POA: Diagnosis not present

## 2023-04-18 DIAGNOSIS — H52222 Regular astigmatism, left eye: Secondary | ICD-10-CM | POA: Diagnosis not present

## 2023-04-18 DIAGNOSIS — E113293 Type 2 diabetes mellitus with mild nonproliferative diabetic retinopathy without macular edema, bilateral: Secondary | ICD-10-CM | POA: Diagnosis not present

## 2023-04-25 DIAGNOSIS — E119 Type 2 diabetes mellitus without complications: Secondary | ICD-10-CM | POA: Insufficient documentation

## 2023-04-25 DIAGNOSIS — I10 Essential (primary) hypertension: Secondary | ICD-10-CM | POA: Insufficient documentation

## 2023-06-06 DIAGNOSIS — G5602 Carpal tunnel syndrome, left upper limb: Secondary | ICD-10-CM | POA: Diagnosis not present

## 2023-06-06 DIAGNOSIS — M720 Palmar fascial fibromatosis [Dupuytren]: Secondary | ICD-10-CM | POA: Diagnosis not present

## 2023-06-06 DIAGNOSIS — G5622 Lesion of ulnar nerve, left upper limb: Secondary | ICD-10-CM | POA: Diagnosis not present

## 2023-06-15 DIAGNOSIS — M25532 Pain in left wrist: Secondary | ICD-10-CM | POA: Diagnosis not present

## 2023-06-15 DIAGNOSIS — M25632 Stiffness of left wrist, not elsewhere classified: Secondary | ICD-10-CM | POA: Diagnosis not present

## 2023-06-15 DIAGNOSIS — M25522 Pain in left elbow: Secondary | ICD-10-CM | POA: Diagnosis not present

## 2023-06-27 DIAGNOSIS — M25632 Stiffness of left wrist, not elsewhere classified: Secondary | ICD-10-CM | POA: Diagnosis not present

## 2023-06-27 DIAGNOSIS — M25522 Pain in left elbow: Secondary | ICD-10-CM | POA: Diagnosis not present

## 2023-06-27 DIAGNOSIS — M25532 Pain in left wrist: Secondary | ICD-10-CM | POA: Diagnosis not present

## 2023-06-29 DIAGNOSIS — I10 Essential (primary) hypertension: Secondary | ICD-10-CM | POA: Diagnosis not present

## 2023-06-29 DIAGNOSIS — E559 Vitamin D deficiency, unspecified: Secondary | ICD-10-CM | POA: Diagnosis not present

## 2023-07-04 DIAGNOSIS — M25632 Stiffness of left wrist, not elsewhere classified: Secondary | ICD-10-CM | POA: Diagnosis not present

## 2023-07-04 DIAGNOSIS — M25522 Pain in left elbow: Secondary | ICD-10-CM | POA: Diagnosis not present

## 2023-07-04 DIAGNOSIS — M25532 Pain in left wrist: Secondary | ICD-10-CM | POA: Diagnosis not present

## 2023-07-06 DIAGNOSIS — E559 Vitamin D deficiency, unspecified: Secondary | ICD-10-CM | POA: Diagnosis not present

## 2023-07-06 DIAGNOSIS — I10 Essential (primary) hypertension: Secondary | ICD-10-CM | POA: Diagnosis not present

## 2023-07-06 DIAGNOSIS — B372 Candidiasis of skin and nail: Secondary | ICD-10-CM | POA: Diagnosis not present

## 2023-07-06 DIAGNOSIS — M810 Age-related osteoporosis without current pathological fracture: Secondary | ICD-10-CM | POA: Diagnosis not present

## 2023-07-06 DIAGNOSIS — I2510 Atherosclerotic heart disease of native coronary artery without angina pectoris: Secondary | ICD-10-CM | POA: Diagnosis not present

## 2023-07-06 DIAGNOSIS — E1140 Type 2 diabetes mellitus with diabetic neuropathy, unspecified: Secondary | ICD-10-CM | POA: Diagnosis not present

## 2023-07-06 DIAGNOSIS — Z0000 Encounter for general adult medical examination without abnormal findings: Secondary | ICD-10-CM | POA: Diagnosis not present

## 2023-07-06 DIAGNOSIS — G250 Essential tremor: Secondary | ICD-10-CM | POA: Diagnosis not present

## 2023-07-06 DIAGNOSIS — Z23 Encounter for immunization: Secondary | ICD-10-CM | POA: Diagnosis not present

## 2023-07-06 DIAGNOSIS — G243 Spasmodic torticollis: Secondary | ICD-10-CM | POA: Diagnosis not present

## 2023-07-06 DIAGNOSIS — N1832 Chronic kidney disease, stage 3b: Secondary | ICD-10-CM | POA: Diagnosis not present

## 2023-08-01 DIAGNOSIS — M720 Palmar fascial fibromatosis [Dupuytren]: Secondary | ICD-10-CM | POA: Diagnosis not present

## 2023-09-06 DIAGNOSIS — E1165 Type 2 diabetes mellitus with hyperglycemia: Secondary | ICD-10-CM | POA: Diagnosis not present

## 2023-09-19 DIAGNOSIS — E782 Mixed hyperlipidemia: Secondary | ICD-10-CM | POA: Diagnosis not present

## 2023-09-19 DIAGNOSIS — E1122 Type 2 diabetes mellitus with diabetic chronic kidney disease: Secondary | ICD-10-CM | POA: Diagnosis not present

## 2023-09-19 DIAGNOSIS — I10 Essential (primary) hypertension: Secondary | ICD-10-CM | POA: Diagnosis not present

## 2023-09-19 DIAGNOSIS — I2510 Atherosclerotic heart disease of native coronary artery without angina pectoris: Secondary | ICD-10-CM | POA: Diagnosis not present

## 2023-09-19 DIAGNOSIS — N1832 Chronic kidney disease, stage 3b: Secondary | ICD-10-CM | POA: Diagnosis not present

## 2023-09-19 DIAGNOSIS — B351 Tinea unguium: Secondary | ICD-10-CM | POA: Diagnosis not present

## 2023-10-05 ENCOUNTER — Other Ambulatory Visit: Payer: Self-pay | Admitting: Podiatry

## 2023-10-05 ENCOUNTER — Ambulatory Visit (INDEPENDENT_AMBULATORY_CARE_PROVIDER_SITE_OTHER): Payer: Medicare Other | Admitting: Podiatry

## 2023-10-05 DIAGNOSIS — M79675 Pain in left toe(s): Secondary | ICD-10-CM | POA: Diagnosis not present

## 2023-10-05 DIAGNOSIS — B351 Tinea unguium: Secondary | ICD-10-CM | POA: Diagnosis not present

## 2023-10-05 DIAGNOSIS — E1151 Type 2 diabetes mellitus with diabetic peripheral angiopathy without gangrene: Secondary | ICD-10-CM

## 2023-10-05 DIAGNOSIS — M79674 Pain in right toe(s): Secondary | ICD-10-CM | POA: Diagnosis not present

## 2023-10-05 MED ORDER — CICLOPIROX 8 % EX SOLN: Freq: Every day | CUTANEOUS | 11 refills | Status: AC

## 2023-10-05 NOTE — Progress Notes (Signed)
 Subjective:  Patient ID: Lori Michael, female    DOB: 26-Oct-1940,  MRN: [id number]   Lori Michael presents to clinic today for:  Chief Complaint  Patient presents with   [facility]    [facility], nails are thick yellow and long. 2nd toe on left is growing straight up.  She would like to know what she can soak her feet in being that she is diabetic. Last A1c 7.1  Last seen pcp in Jan. No anticoags.   Patient notes nails are thick, discolored, elongated and painful in shoegear when trying to ambulate.  She had the right hallux nail removed a long time ago due to thickness and fungal involvement.  She is interested in using an antifungal on the remainder of her toenails, most notably the left first, second, and third on the left foot stating these are the most involved.  Patient is diabetic and notes that her last A1c was 7.1  PCP is Clarice Nottingham, MD.  Past Medical History:  Diagnosis Date   Carpal tunnel syndrome on left    Diabetes mellitus ORAL MEDS   Fibromyalgia    Frequency of urination    Hypertension    Hypertriglyceridemia    Insomnia    PONV (postoperative nausea and vomiting)    Type 2 diabetes mellitus (HCC)    with retinopathy    Past Surgical History:  Procedure Laterality Date   bilateral eye implants  11/2013   CARPAL TUNNEL RELEASE  09/14/2011   Procedure: CARPAL TUNNEL RELEASE;  Surgeon: Lynwood SQUIBB [place]ton;  Location: [facility];  Service: Orthopedics;  Laterality: Left;   DUPUYTREN CONTRACTURE RELEASE  09/14/2011   Procedure: DUPUYTREN CONTRACTURE RELEASE;  Surgeon: Lynwood SQUIBB [place];  Location: [facility];  Service: Orthopedics;  Laterality: Left;   right eye calcium removal  01/2018   TONSILLECTOMY  AGE 43   TOTAL ABDOMINAL HYSTERECTOMY W/ BILATERAL SALPINGOOPHORECTOMY  AGE 62    Allergies  Allergen Reactions   Fentanyl  Anaphylaxis    Unable to move   Latex Hives and Rash    gloves    Review of Systems: Negative except as  noted in the HPI.  Objective:  Lori Michael is a pleasant 83 y.o. female in NAD. AAO x 3.  Vascular Examination: Capillary refill time is 3-5 seconds to toes bilateral.  Trace palpable pedal pulses b/l LE. Digital hair sparse b/l.  Skin temperature gradient WNL b/l.  Mild varicosities b/l. No cyanosis noted b/l.   Dermatological Examination: Pedal skin with decreased turgor, texture and tone b/l. No open wounds. No interdigital macerations b/l. Toenails x 9 are 3mm thick, discolored, dystrophic with subungual debris. There is pain with compression of the nail plates.  They are elongated x 9  Assessment/Plan: 1. Dermatophytosis of nail   2. Pain in left toe(s)   3. Pain in right toe(s)   4. Type II diabetes mellitus with peripheral circulatory disorder (HCC)     Meds ordered this encounter  Medications   ciclopirox  (PENLAC ) 8 % solution    Sig: Apply topically at bedtime. Apply over nail once daily. Apply daily over previous coat. Remove weekly with polish remover.    Dispense:  6.6 mL    Refill:  11    The mycotic toenails were sharply debrided x10 with sterile nail nippers and a power debriding burr to decrease bulk/thickness and length.    Discussed prescription ciclopirox  nail lacquer to apply to the  affected toenails once daily.  She will apply this each day and then remove at the end of each week with the use of nail polish remover or rubbing alcohol.  She will then resume her daily application.  She was informed that she may need to use this for up to a year and that no guarantees to success are noted.  Return in about 3 months (around 01/02/2024) for [facility].   Lori Michael, DPM, FACFAS [facility]     [address] [place], [place] [address]                Office [phone]  Fax [phone]

## 2023-10-11 DIAGNOSIS — M8589 Other specified disorders of bone density and structure, multiple sites: Secondary | ICD-10-CM | POA: Diagnosis not present

## 2023-10-11 DIAGNOSIS — E559 Vitamin D deficiency, unspecified: Secondary | ICD-10-CM | POA: Diagnosis not present

## 2023-10-11 DIAGNOSIS — M810 Age-related osteoporosis without current pathological fracture: Secondary | ICD-10-CM | POA: Diagnosis not present

## 2023-10-25 DIAGNOSIS — M810 Age-related osteoporosis without current pathological fracture: Secondary | ICD-10-CM | POA: Diagnosis not present

## 2023-11-03 DIAGNOSIS — M720 Palmar fascial fibromatosis [Dupuytren]: Secondary | ICD-10-CM | POA: Diagnosis not present

## 2024-01-03 DIAGNOSIS — M720 Palmar fascial fibromatosis [Dupuytren]: Secondary | ICD-10-CM | POA: Diagnosis not present

## 2024-01-04 ENCOUNTER — Ambulatory Visit: Payer: Medicare Other | Admitting: Podiatry

## 2024-01-06 DIAGNOSIS — M720 Palmar fascial fibromatosis [Dupuytren]: Secondary | ICD-10-CM | POA: Diagnosis not present

## 2024-01-06 DIAGNOSIS — M25642 Stiffness of left hand, not elsewhere classified: Secondary | ICD-10-CM | POA: Diagnosis not present

## 2024-01-06 DIAGNOSIS — M79642 Pain in left hand: Secondary | ICD-10-CM | POA: Diagnosis not present

## 2024-01-13 DIAGNOSIS — M79642 Pain in left hand: Secondary | ICD-10-CM | POA: Diagnosis not present

## 2024-01-13 DIAGNOSIS — M25632 Stiffness of left wrist, not elsewhere classified: Secondary | ICD-10-CM | POA: Diagnosis not present

## 2024-01-13 DIAGNOSIS — M25532 Pain in left wrist: Secondary | ICD-10-CM | POA: Diagnosis not present

## 2024-01-13 DIAGNOSIS — M25522 Pain in left elbow: Secondary | ICD-10-CM | POA: Diagnosis not present

## 2024-01-13 DIAGNOSIS — M25642 Stiffness of left hand, not elsewhere classified: Secondary | ICD-10-CM | POA: Diagnosis not present

## 2024-01-17 DIAGNOSIS — E1122 Type 2 diabetes mellitus with diabetic chronic kidney disease: Secondary | ICD-10-CM | POA: Diagnosis not present

## 2024-01-20 DIAGNOSIS — M25632 Stiffness of left wrist, not elsewhere classified: Secondary | ICD-10-CM | POA: Diagnosis not present

## 2024-01-20 DIAGNOSIS — M25532 Pain in left wrist: Secondary | ICD-10-CM | POA: Diagnosis not present

## 2024-01-20 DIAGNOSIS — M79642 Pain in left hand: Secondary | ICD-10-CM | POA: Diagnosis not present

## 2024-01-20 DIAGNOSIS — M25522 Pain in left elbow: Secondary | ICD-10-CM | POA: Diagnosis not present

## 2024-01-20 DIAGNOSIS — M25642 Stiffness of left hand, not elsewhere classified: Secondary | ICD-10-CM | POA: Diagnosis not present

## 2024-01-24 DIAGNOSIS — E1122 Type 2 diabetes mellitus with diabetic chronic kidney disease: Secondary | ICD-10-CM | POA: Diagnosis not present

## 2024-01-24 DIAGNOSIS — I2510 Atherosclerotic heart disease of native coronary artery without angina pectoris: Secondary | ICD-10-CM | POA: Diagnosis not present

## 2024-01-24 DIAGNOSIS — I10 Essential (primary) hypertension: Secondary | ICD-10-CM | POA: Diagnosis not present

## 2024-01-24 DIAGNOSIS — N1832 Chronic kidney disease, stage 3b: Secondary | ICD-10-CM | POA: Diagnosis not present

## 2024-01-30 DIAGNOSIS — M720 Palmar fascial fibromatosis [Dupuytren]: Secondary | ICD-10-CM | POA: Diagnosis not present

## 2024-03-12 DIAGNOSIS — M720 Palmar fascial fibromatosis [Dupuytren]: Secondary | ICD-10-CM | POA: Diagnosis not present

## 2024-04-16 DIAGNOSIS — M720 Palmar fascial fibromatosis [Dupuytren]: Secondary | ICD-10-CM | POA: Diagnosis not present

## 2024-04-19 DIAGNOSIS — M720 Palmar fascial fibromatosis [Dupuytren]: Secondary | ICD-10-CM | POA: Diagnosis not present

## 2024-04-19 DIAGNOSIS — M25642 Stiffness of left hand, not elsewhere classified: Secondary | ICD-10-CM | POA: Diagnosis not present

## 2024-04-19 DIAGNOSIS — M79642 Pain in left hand: Secondary | ICD-10-CM | POA: Diagnosis not present

## 2024-04-24 DIAGNOSIS — E1122 Type 2 diabetes mellitus with diabetic chronic kidney disease: Secondary | ICD-10-CM | POA: Diagnosis not present

## 2024-04-24 DIAGNOSIS — M810 Age-related osteoporosis without current pathological fracture: Secondary | ICD-10-CM | POA: Diagnosis not present

## 2024-05-02 DIAGNOSIS — I10 Essential (primary) hypertension: Secondary | ICD-10-CM | POA: Diagnosis not present

## 2024-05-02 DIAGNOSIS — N1832 Chronic kidney disease, stage 3b: Secondary | ICD-10-CM | POA: Diagnosis not present

## 2024-05-02 DIAGNOSIS — I2510 Atherosclerotic heart disease of native coronary artery without angina pectoris: Secondary | ICD-10-CM | POA: Diagnosis not present

## 2024-05-02 DIAGNOSIS — M810 Age-related osteoporosis without current pathological fracture: Secondary | ICD-10-CM | POA: Diagnosis not present

## 2024-05-02 DIAGNOSIS — E1122 Type 2 diabetes mellitus with diabetic chronic kidney disease: Secondary | ICD-10-CM | POA: Diagnosis not present

## 2024-05-02 DIAGNOSIS — E782 Mixed hyperlipidemia: Secondary | ICD-10-CM | POA: Diagnosis not present

## 2024-05-02 DIAGNOSIS — R300 Dysuria: Secondary | ICD-10-CM | POA: Diagnosis not present

## 2024-05-07 DIAGNOSIS — M79642 Pain in left hand: Secondary | ICD-10-CM | POA: Diagnosis not present

## 2024-05-07 DIAGNOSIS — M25642 Stiffness of left hand, not elsewhere classified: Secondary | ICD-10-CM | POA: Diagnosis not present

## 2024-05-07 DIAGNOSIS — M720 Palmar fascial fibromatosis [Dupuytren]: Secondary | ICD-10-CM | POA: Diagnosis not present

## 2024-05-21 DIAGNOSIS — M79642 Pain in left hand: Secondary | ICD-10-CM | POA: Diagnosis not present

## 2024-05-21 DIAGNOSIS — M25642 Stiffness of left hand, not elsewhere classified: Secondary | ICD-10-CM | POA: Diagnosis not present

## 2024-07-04 DIAGNOSIS — E118 Type 2 diabetes mellitus with unspecified complications: Secondary | ICD-10-CM | POA: Diagnosis not present

## 2024-07-04 DIAGNOSIS — I10 Essential (primary) hypertension: Secondary | ICD-10-CM | POA: Diagnosis not present

## 2024-07-04 DIAGNOSIS — E782 Mixed hyperlipidemia: Secondary | ICD-10-CM | POA: Diagnosis not present

## 2024-07-09 DIAGNOSIS — E113319 Type 2 diabetes mellitus with moderate nonproliferative diabetic retinopathy with macular edema, unspecified eye: Secondary | ICD-10-CM | POA: Diagnosis not present

## 2024-07-09 DIAGNOSIS — Z0000 Encounter for general adult medical examination without abnormal findings: Secondary | ICD-10-CM | POA: Diagnosis not present

## 2024-07-09 DIAGNOSIS — E1140 Type 2 diabetes mellitus with diabetic neuropathy, unspecified: Secondary | ICD-10-CM | POA: Diagnosis not present

## 2024-07-09 DIAGNOSIS — N1832 Chronic kidney disease, stage 3b: Secondary | ICD-10-CM | POA: Diagnosis not present

## 2024-07-09 DIAGNOSIS — Z23 Encounter for immunization: Secondary | ICD-10-CM | POA: Diagnosis not present

## 2024-07-09 DIAGNOSIS — I2510 Atherosclerotic heart disease of native coronary artery without angina pectoris: Secondary | ICD-10-CM | POA: Diagnosis not present

## 2024-07-09 DIAGNOSIS — E871 Hypo-osmolality and hyponatremia: Secondary | ICD-10-CM | POA: Diagnosis not present

## 2024-07-09 DIAGNOSIS — G250 Essential tremor: Secondary | ICD-10-CM | POA: Diagnosis not present

## 2024-07-09 DIAGNOSIS — M810 Age-related osteoporosis without current pathological fracture: Secondary | ICD-10-CM | POA: Diagnosis not present

## 2024-07-09 DIAGNOSIS — E876 Hypokalemia: Secondary | ICD-10-CM | POA: Diagnosis not present

## 2024-07-09 DIAGNOSIS — D890 Polyclonal hypergammaglobulinemia: Secondary | ICD-10-CM | POA: Diagnosis not present

## 2024-07-09 DIAGNOSIS — I10 Essential (primary) hypertension: Secondary | ICD-10-CM | POA: Diagnosis not present

## 2024-07-16 DIAGNOSIS — M720 Palmar fascial fibromatosis [Dupuytren]: Secondary | ICD-10-CM | POA: Diagnosis not present

## 2024-08-13 DIAGNOSIS — E871 Hypo-osmolality and hyponatremia: Secondary | ICD-10-CM | POA: Diagnosis not present

## 2024-08-13 DIAGNOSIS — E876 Hypokalemia: Secondary | ICD-10-CM | POA: Diagnosis not present

## 2024-08-13 DIAGNOSIS — R748 Abnormal levels of other serum enzymes: Secondary | ICD-10-CM | POA: Diagnosis not present
